# Patient Record
Sex: Female | Born: 1970 | Hispanic: Yes | Marital: Married | Smoking: Never smoker
Health system: Southern US, Community
[De-identification: ages and names within clinical notes are randomized; demographics above are authoritative.]

## PROBLEM LIST (undated history)

## (undated) DIAGNOSIS — R519 Headache, unspecified: Secondary | ICD-10-CM

## (undated) DIAGNOSIS — R51 Headache: Secondary | ICD-10-CM

## (undated) HISTORY — PX: NO PAST SURGERIES: SHX2092

---

## 2004-12-01 ENCOUNTER — Inpatient Hospital Stay (HOSPITAL_COMMUNITY): Admission: AD | Admit: 2004-12-01 | Discharge: 2004-12-01 | Payer: Self-pay | Admitting: *Deleted

## 2004-12-03 ENCOUNTER — Inpatient Hospital Stay (HOSPITAL_COMMUNITY): Admission: AD | Admit: 2004-12-03 | Discharge: 2004-12-03 | Payer: Self-pay | Admitting: *Deleted

## 2004-12-08 ENCOUNTER — Inpatient Hospital Stay (HOSPITAL_COMMUNITY): Admission: RE | Admit: 2004-12-08 | Discharge: 2004-12-08 | Payer: Self-pay | Admitting: *Deleted

## 2004-12-15 ENCOUNTER — Inpatient Hospital Stay (HOSPITAL_COMMUNITY): Admission: RE | Admit: 2004-12-15 | Discharge: 2004-12-15 | Payer: Self-pay | Admitting: *Deleted

## 2005-01-14 ENCOUNTER — Ambulatory Visit: Payer: Self-pay | Admitting: Family Medicine

## 2005-03-11 ENCOUNTER — Inpatient Hospital Stay (HOSPITAL_COMMUNITY): Admission: AD | Admit: 2005-03-11 | Discharge: 2005-03-11 | Payer: Self-pay | Admitting: Obstetrics and Gynecology

## 2005-07-01 ENCOUNTER — Ambulatory Visit: Payer: Self-pay | Admitting: Obstetrics and Gynecology

## 2005-11-05 ENCOUNTER — Inpatient Hospital Stay (HOSPITAL_COMMUNITY): Admission: AD | Admit: 2005-11-05 | Discharge: 2005-11-08 | Payer: Self-pay | Admitting: Obstetrics and Gynecology

## 2005-11-05 ENCOUNTER — Ambulatory Visit: Payer: Self-pay | Admitting: *Deleted

## 2010-06-06 ENCOUNTER — Emergency Department (HOSPITAL_COMMUNITY): Admission: EM | Admit: 2010-06-06 | Discharge: 2010-06-06 | Payer: Self-pay | Admitting: Emergency Medicine

## 2011-02-28 LAB — URINE MICROSCOPIC-ADD ON

## 2011-02-28 LAB — URINALYSIS, ROUTINE W REFLEX MICROSCOPIC
Bilirubin Urine: NEGATIVE
Glucose, UA: NEGATIVE mg/dL
Ketones, ur: 15 mg/dL — AB
Nitrite: POSITIVE — AB
Protein, ur: NEGATIVE mg/dL
Specific Gravity, Urine: 1.014 (ref 1.005–1.030)
Urobilinogen, UA: 1 mg/dL (ref 0.0–1.0)
pH: 7.5 (ref 5.0–8.0)

## 2011-02-28 LAB — DIFFERENTIAL
Basophils Absolute: 0 10*3/uL (ref 0.0–0.1)
Basophils Relative: 0 % (ref 0–1)
Eosinophils Absolute: 0 10*3/uL (ref 0.0–0.7)
Eosinophils Relative: 0 % (ref 0–5)
Lymphocytes Relative: 4 % — ABNORMAL LOW (ref 12–46)
Lymphs Abs: 0.4 10*3/uL — ABNORMAL LOW (ref 0.7–4.0)
Monocytes Absolute: 0.2 10*3/uL (ref 0.1–1.0)
Monocytes Relative: 2 % — ABNORMAL LOW (ref 3–12)
Neutro Abs: 10.2 10*3/uL — ABNORMAL HIGH (ref 1.7–7.7)
Neutrophils Relative %: 95 % — ABNORMAL HIGH (ref 43–77)

## 2011-02-28 LAB — CBC
HCT: 42.3 % (ref 36.0–46.0)
Hemoglobin: 14.8 g/dL (ref 12.0–15.0)
MCH: 32.6 pg (ref 26.0–34.0)
MCHC: 35 g/dL (ref 30.0–36.0)
MCV: 93.2 fL (ref 78.0–100.0)
Platelets: 156 10*3/uL (ref 150–400)
RBC: 4.55 MIL/uL (ref 3.87–5.11)
RDW: 12.2 % (ref 11.5–15.5)
WBC: 10.7 10*3/uL — ABNORMAL HIGH (ref 4.0–10.5)

## 2011-02-28 LAB — POCT I-STAT, CHEM 8
BUN: 7 mg/dL (ref 6–23)
Calcium, Ion: 1.11 mmol/L — ABNORMAL LOW (ref 1.12–1.32)
Chloride: 105 mEq/L (ref 96–112)
Creatinine, Ser: 0.7 mg/dL (ref 0.4–1.2)
Glucose, Bld: 122 mg/dL — ABNORMAL HIGH (ref 70–99)
HCT: 45 % (ref 36.0–46.0)
Hemoglobin: 15.3 g/dL — ABNORMAL HIGH (ref 12.0–15.0)
Potassium: 3.3 mEq/L — ABNORMAL LOW (ref 3.5–5.1)
Sodium: 136 mEq/L (ref 135–145)
TCO2: 20 mmol/L (ref 0–100)

## 2011-02-28 LAB — POCT PREGNANCY, URINE: Preg Test, Ur: NEGATIVE

## 2011-04-30 NOTE — Group Therapy Note (Signed)
Kathleen Gutierrez, BRADNER                  ACCOUNT NO.:  [id number]   MEDICAL RECORD NO.:  [id number]          PATIENT TYPE:  WOC   LOCATION:  [facility]                   FACILITY:  [facility]   PHYSICIAN:  Tinnie Gens, MD        DATE OF BIRTH:  1971-06-13   DATE OF SERVICE:  01/14/2005                                    CLINIC NOTE   CHIEF COMPLAINT:  Follow-up SAB.   HISTORY OF PRESENT ILLNESS:  Patient is a 40 year old G2, P1-0-1-1 who is  status post missed AB and Cytotec use to complete the AB on December 15, 2004.  She reported that she did pass tissue including a sac and that she had a  normal period on January 09, 2005.  The patient complains of some vaginal  itching and vaginal discharge.   PHYSICAL EXAMINATION:  VITAL SIGNS:  As noted in the chart.  GENERAL:  She is a well-developed, well-nourished Hispanic female in no  acute distress.  ABDOMEN:  Soft, nontender, nondistended.  GENITOURINARY:  Normal external female genitalia.  The vagina is pink and  rugated.  She has a moderate amount of yellow-brown discharge noted.  The  cervix is without lesion.  The uterus is small, anteverted.  Adnexa without  mass or tenderness.  Wet prep shows moderate clue cells.   IMPRESSION:  1.  Status post spontaneous abortion, complete.  2.  Bacterial vaginosis.   PLAN:  1.  Discussed with the patient contraception.  She will use spermicidal foam      and condoms.  She understands that she is not to get pregnant for      approximately three months.  2.  BV.  Patient will take Flagyl 500 mg p.o. b.i.d. for seven days.      TP/MEDQ  D:  01/14/2005  T:  01/14/2005  Job:  [id number]

## 2014-12-13 NOTE — L&D Delivery Note (Signed)
Delivery Note At 4:48 AM a viable female was delivered via Vaginal, Spontaneous Delivery (Presentation: ; Occiput Anterior).  APGAR: 2, 4; weight 2 lb 11.4 oz (1230 g).   Placenta status: Intact, Spontaneous Pathology.  Cord: 3 vessels with the following complications: None.  Cord pH: none  Anesthesia: Epidural  Episiotomy: None Lacerations: None Suture Repair: none Est. Blood Loss (mL): 150  Mom to postpartum.  Baby to NICU for prematurity.  Javaeh Muscatello A 04/25/2015, 5:56 AM

## 2015-01-04 LAB — OB RESULTS CONSOLE RPR: RPR: NONREACTIVE

## 2015-01-05 LAB — OB RESULTS CONSOLE HEPATITIS B SURFACE ANTIGEN: HEP B S AG: NEGATIVE

## 2015-01-05 LAB — OB RESULTS CONSOLE RUBELLA ANTIBODY, IGM: Rubella: NON-IMMUNE/NOT IMMUNE

## 2015-01-05 LAB — OB RESULTS CONSOLE RPR: RPR: NONREACTIVE

## 2015-01-05 LAB — OB RESULTS CONSOLE HIV ANTIBODY (ROUTINE TESTING): HIV: NONREACTIVE

## 2015-01-21 ENCOUNTER — Other Ambulatory Visit (HOSPITAL_COMMUNITY): Payer: Self-pay | Admitting: Maternal and Fetal Medicine

## 2015-01-21 DIAGNOSIS — O09522 Supervision of elderly multigravida, second trimester: Secondary | ICD-10-CM

## 2015-01-22 LAB — OB RESULTS CONSOLE GC/CHLAMYDIA: Gonorrhea: NEGATIVE

## 2015-01-22 LAB — OB RESULTS CONSOLE RPR: RPR: NONREACTIVE

## 2015-01-29 ENCOUNTER — Ambulatory Visit (HOSPITAL_COMMUNITY)
Admission: RE | Admit: 2015-01-29 | Discharge: 2015-01-29 | Disposition: A | Discharge status: Home / Self Care | Payer: Self-pay | Source: Ambulatory Visit | Attending: Maternal and Fetal Medicine | Admitting: Maternal and Fetal Medicine

## 2015-01-29 ENCOUNTER — Encounter (HOSPITAL_COMMUNITY): Payer: Self-pay

## 2015-01-29 ENCOUNTER — Ambulatory Visit (HOSPITAL_COMMUNITY)
Admission: RE | Admit: 2015-01-29 | Discharge: 2015-01-29 | Disposition: A | Discharge status: Home / Self Care | Payer: Self-pay | Source: Ambulatory Visit | Attending: Obstetrics | Admitting: Obstetrics

## 2015-01-29 ENCOUNTER — Other Ambulatory Visit (HOSPITAL_COMMUNITY): Payer: Self-pay | Admitting: Maternal and Fetal Medicine

## 2015-01-29 DIAGNOSIS — O09522 Supervision of elderly multigravida, second trimester: Secondary | ICD-10-CM

## 2015-01-29 DIAGNOSIS — O09529 Supervision of elderly multigravida, unspecified trimester: Secondary | ICD-10-CM | POA: Insufficient documentation

## 2015-01-29 DIAGNOSIS — Z3A15 15 weeks gestation of pregnancy: Secondary | ICD-10-CM | POA: Insufficient documentation

## 2015-01-29 DIAGNOSIS — O3662X0 Maternal care for excessive fetal growth, second trimester, not applicable or unspecified: Secondary | ICD-10-CM | POA: Insufficient documentation

## 2015-01-29 DIAGNOSIS — Z315 Encounter for genetic counseling: Secondary | ICD-10-CM | POA: Insufficient documentation

## 2015-01-29 DIAGNOSIS — Z3689 Encounter for other specified antenatal screening: Secondary | ICD-10-CM | POA: Insufficient documentation

## 2015-01-29 HISTORY — DX: Headache: R51

## 2015-01-29 HISTORY — DX: Headache, unspecified: R51.9

## 2015-01-29 LAB — CHROMOSOMES ANALYSIS FOR CF

## 2015-01-29 LAB — QUAD SCREEN FOR MFM

## 2015-01-29 NOTE — Progress Notes (Signed)
Appointment Date:01/29/2015  DOB: 04/20/1971 Referring Provider: Kathreen CosierMarshall, Bernard A, MD Attending: Alpha GulaPaul Whitecar, MD  Mrs. Kathleen Gutierrez was seen for genetic counseling because of a maternal age of 44 years.  She was counseled regarding maternal age and the association with risk for chromosome conditions due to nondisjunction. We reviewed chromosomes, nondisjunction, and the associated 1 in 35 risk for fetal aneuploidy related to a maternal age of 44 at 215 weeks gestation. We specifically discussed Down syndrome (trisomy 6721), trisomies 5713 and 8318, and sex chromosome aneuploidies (47,XXX and 47,XXY) including the common features and prognoses of each.  We reviewed available screening options including Quad screen, noninvasive prenatal screening (NIPS)/cell free DNA (cfDNA) testing, and detailed ultrasound. She was counseled that screening tests are used to modify a patient's a priori risk for aneuploidy, typically based on age. This estimate provides a pregnancy specific risk assessment. We reviewed the benefits and limitations of each option. Specifically, we discussed the conditions for which each test screens, the detection rates, and false positive rates of each. She was also counseled regarding diagnostic testing via amniocentesis. We reviewed the approximate 1 in 300-500 risk for complications from amniocentesis, including spontaneous pregnancy loss. After consideration of all the options, she elected to proceed with Quad screen. Those results will be available in 8-10 days.  The patient also expressed interest in having a detailed ultrasound. While ultrasound was performed today, the anatomy was limited by her early gestational age.  She is scheduled to return around [redacted] weeks gestation for a targeted anatomy ultrasound.   [name] was provided with written information regarding cystic fibrosis (CF) including the carrier frequency and incidence in the Hispanic population, the  availability of carrier testing and prenatal diagnosis if indicated. In addition, we discussed that CF is routinely screened for as part of the [place] newborn screening panel. She elected to proceed with testing today.  This is the fifth pregnancy for [name] and her 42 year-old husband. The couple have two healthy children and also had two early first trimester miscarriages. Both family histories were reviewed and found to be noncontributory for birth defects, intellectual disability, and known genetic conditions. [name] and her husband are both originally from [place]. Consanguinity was denied.  [name] denied exposure to environmental toxins or chemical agents. She denied the use of alcohol, tobacco or street drugs. She denied significant viral illnesses during the course of her pregnancy. Her medical and surgical histories were noncontributory.   I counseled [name] regarding the above risks and available options. The approximate face-to-face time with the genetic counselor was 40 minutes.  Most of the counseling was provided by Chelsey Burden, [employer] genetic counseling student, under my direct supervision.    Kathleen Gemmaaragh Wyndell Cardiff, MS,  Certified Genetic Counselor

## 2015-02-05 ENCOUNTER — Telehealth (HOSPITAL_COMMUNITY): Payer: Self-pay | Admitting: Genetics

## 2015-02-05 ENCOUNTER — Other Ambulatory Visit (HOSPITAL_COMMUNITY): Payer: Self-pay

## 2015-02-05 NOTE — Telephone Encounter (Signed)
Called Kathleen Gutierrez to review the results of her Quad screen and CF carrier screening.  Kathleen Gutierrez was identified by name and DOB.  We discussed that the Quad screen identified a low risk for Down syndrome, Trisomy 18 or spina bifida.  Additionally, the carrier testing for cystic fibrosis did not identify a change in the CF gene.  She was counseled that these are screening tests which are able to reduce her chance to have these conditions in her baby, but can not eliminate the chance.  All of her questions were answered. Mady Gemmaaragh Teller [place]field, MS Certified Genetic Counselor

## 2015-02-21 ENCOUNTER — Encounter (HOSPITAL_COMMUNITY): Payer: Self-pay

## 2015-02-21 ENCOUNTER — Ambulatory Visit (HOSPITAL_COMMUNITY)
Admission: RE | Admit: 2015-02-21 | Discharge: 2015-02-21 | Disposition: A | Discharge status: Home / Self Care | Payer: Self-pay | Source: Ambulatory Visit | Attending: Obstetrics | Admitting: Obstetrics

## 2015-02-21 ENCOUNTER — Other Ambulatory Visit (HOSPITAL_COMMUNITY): Payer: Self-pay | Admitting: Maternal and Fetal Medicine

## 2015-02-21 DIAGNOSIS — Z3A18 18 weeks gestation of pregnancy: Secondary | ICD-10-CM | POA: Insufficient documentation

## 2015-02-21 DIAGNOSIS — Z0489 Encounter for examination and observation for other specified reasons: Secondary | ICD-10-CM | POA: Insufficient documentation

## 2015-02-21 DIAGNOSIS — IMO0002 Reserved for concepts with insufficient information to code with codable children: Secondary | ICD-10-CM | POA: Insufficient documentation

## 2015-02-21 DIAGNOSIS — O09522 Supervision of elderly multigravida, second trimester: Secondary | ICD-10-CM | POA: Insufficient documentation

## 2015-02-21 DIAGNOSIS — Z36 Encounter for antenatal screening of mother: Secondary | ICD-10-CM | POA: Insufficient documentation

## 2015-04-22 ENCOUNTER — Encounter (HOSPITAL_COMMUNITY): Payer: Self-pay | Admitting: *Deleted

## 2015-04-22 ENCOUNTER — Inpatient Hospital Stay (HOSPITAL_COMMUNITY): Payer: Self-pay

## 2015-04-22 ENCOUNTER — Inpatient Hospital Stay (HOSPITAL_COMMUNITY)
Admission: AD | Admit: 2015-04-22 | Discharge: 2015-04-22 | Disposition: A | Discharge status: Home / Self Care | Payer: Self-pay | Source: Ambulatory Visit | Attending: Obstetrics | Admitting: Obstetrics

## 2015-04-22 DIAGNOSIS — O9989 Other specified diseases and conditions complicating pregnancy, childbirth and the puerperium: Secondary | ICD-10-CM | POA: Insufficient documentation

## 2015-04-22 DIAGNOSIS — R102 Pelvic and perineal pain: Secondary | ICD-10-CM

## 2015-04-22 DIAGNOSIS — O26872 Cervical shortening, second trimester: Secondary | ICD-10-CM

## 2015-04-22 DIAGNOSIS — Z3A26 26 weeks gestation of pregnancy: Secondary | ICD-10-CM

## 2015-04-22 DIAGNOSIS — N949 Unspecified condition associated with female genital organs and menstrual cycle: Secondary | ICD-10-CM

## 2015-04-22 DIAGNOSIS — N898 Other specified noninflammatory disorders of vagina: Secondary | ICD-10-CM | POA: Insufficient documentation

## 2015-04-22 DIAGNOSIS — O26892 Other specified pregnancy related conditions, second trimester: Secondary | ICD-10-CM

## 2015-04-22 DIAGNOSIS — Z3A27 27 weeks gestation of pregnancy: Secondary | ICD-10-CM | POA: Insufficient documentation

## 2015-04-22 LAB — WET PREP, GENITAL
Clue Cells Wet Prep HPF POC: NONE SEEN
Trich, Wet Prep: NONE SEEN
Yeast Wet Prep HPF POC: NONE SEEN

## 2015-04-22 LAB — URINALYSIS, ROUTINE W REFLEX MICROSCOPIC
BILIRUBIN URINE: NEGATIVE
GLUCOSE, UA: 100 mg/dL — AB
Ketones, ur: NEGATIVE mg/dL
Nitrite: NEGATIVE
PROTEIN: NEGATIVE mg/dL
Specific Gravity, Urine: 1.01 (ref 1.005–1.030)
Urobilinogen, UA: 0.2 mg/dL (ref 0.0–1.0)
pH: 7 (ref 5.0–8.0)

## 2015-04-22 LAB — POCT FERN TEST: POCT Fern Test: NEGATIVE

## 2015-04-22 LAB — URINE MICROSCOPIC-ADD ON

## 2015-04-22 MED ORDER — TERBUTALINE SULFATE 1 MG/ML IJ SOLN
0.2500 mg | Freq: Once | INTRAMUSCULAR | Status: AC
  Administered 2015-04-22: 0.25 mg via SUBCUTANEOUS
  Filled 2015-04-22: qty 1

## 2015-04-22 MED ORDER — BETAMETHASONE SOD PHOS & ACET 6 (3-3) MG/ML IJ SUSP
12.0000 mg | Freq: Once | INTRAMUSCULAR | Status: AC
  Administered 2015-04-22: 12 mg via INTRAMUSCULAR
  Filled 2015-04-22: qty 2

## 2015-04-22 MED ORDER — PROGESTERONE MICRONIZED 200 MG PO CAPS: ORAL_CAPSULE | ORAL | Status: DC

## 2015-04-22 MED ORDER — NIFEDIPINE ER 60 MG PO TB24: 60.0000 mg | ORAL_TABLET | Freq: Every day | ORAL | Status: DC

## 2015-04-22 NOTE — MAU Note (Signed)
Increase in d/c noted since the weekend, esp at night. Last wk was checked and everything was fine.  No pain, just some discomfort. Can't walk too much.  No pain when goes to bathroom, but when goes to get up-  "feels wide open"

## 2015-04-22 NOTE — Progress Notes (Signed)
Baby active 

## 2015-04-22 NOTE — Progress Notes (Signed)
[name] CNM in to discuss d/c plan with pt. Discussed need to return tomorrow about 1400 for 2nd betamethasone inj and then next Tues 5/17 at 7:30 for u/s at [facility]. Pt voices understanding.D/C to lobby to wait for spouse to pick her up

## 2015-04-22 NOTE — Progress Notes (Signed)
FFN collected and will send depending on what fern slide looks like

## 2015-04-22 NOTE — MAU Note (Signed)
STates sometimes feels "wet" down there.

## 2015-04-22 NOTE — Progress Notes (Signed)
Pt unaware of ctxs. 

## 2015-04-22 NOTE — MAU Provider Note (Signed)
Chief Complaint:  Vaginal Discharge   First Provider Initiated Contact with Patient 04/22/15 1144      HPI: Kathleen Gutierrez is a 44 y.o. [id number] at 60106w2d who presents to maternity admissions reporting increased vaginal discharge and pelvic pressure. Was evaluated in the office 1 week ago by Dr. Gaynell FaceMarshall. States she was told that vaginal discharge with cervical mucus, but that cervix was closed. Describes vaginal discharge is mucous. Denies vaginal bleeding or leaking of fluid. States the pelvic pressure is greater then what she experienced with previous pregnancies.  Unsure if she is having contractions. Good fetal movement.   Past Medical History: Past Medical History  Diagnosis Date  . Headache     Past obstetric history: OB History  Gravida Para Term Preterm AB SAB TAB Ectopic Multiple Living  5 2 2  2 2    2     # Outcome Date GA Lbr Len/2nd Weight Sex Delivery Anes PTL Lv  5 Current           4 SAB           3 SAB           2 Term           1 Term               Past Surgical History: Past Surgical History  Procedure Laterality Date  . No past surgeries       Family History: Family History  Problem Relation Age of Onset  . Hypertension Mother   . Diabetes Father   . Hypertension Sister   . Diabetes Sister   . Hypertension Brother     Social History: History  Substance Use Topics  . Smoking status: Never Smoker   . Smokeless tobacco: Not on file  . Alcohol Use: No    Allergies: No Known Allergies  Meds:  Prescriptions prior to admission  Medication Sig Dispense Refill Last Dose  . Acetaminophen (TYLENOL PO) Take by mouth.   Past Week at Unknown time  . Prenatal Vit-Fe Fumarate-FA (PRENATAL VITAMIN PO) Take by mouth.   04/21/2015 at Unknown time    ROS:  Review of Systems  Constitutional: Negative for fever and chills.  Gastrointestinal: Negative for nausea, vomiting, abdominal pain, diarrhea and constipation.  Genitourinary: Negative for  dysuria, urgency, frequency, hematuria and flank pain.       Negative for vaginal bleeding, leaking of fluid. Positive for vaginal discharge and pelvic pressure.    Physical Exam  Blood pressure 115/61, pulse 95, temperature 98.1 F (36.7 C), temperature source Oral, resp. rate 18, height 5\' 1"  (1.549 m), weight 156 lb (70.761 kg), last menstrual period 10/13/2014. GENERAL: Well-developed, well-nourished female in no acute distress. Anxious. HEART: normal rate RESP: normal effort GI: Abd soft, non-tender, gravid appropriate for gestational age. Pos BS x 4 MS: Extremities nontender, no edema, normal ROM NEURO: Alert and oriented x 4.  GU: NEFG, moderate amount of clear, mucoid, odorless discharge. Negative pooling. no blood, cervix clean. No CVAT Dilation: Closed/?short  FHT:  Baseline 140 , moderate variability, accelerations present, no decelerations Contractions: irreg, mild   Labs: Results for orders placed or performed during the hospital encounter of 04/22/15 (from the past 24 hour(s))  Urinalysis, Routine w reflex microscopic     Status: Abnormal   Collection Time: 04/22/15 10:15 AM  Result Value Ref Range   Color, Urine YELLOW YELLOW   APPearance CLEAR CLEAR   Specific Gravity, Urine 1.010 1.005 -  1.030   pH 7.0 5.0 - 8.0   Glucose, UA 100 (A) NEGATIVE mg/dL   Hgb urine dipstick SMALL (A) NEGATIVE   Bilirubin Urine NEGATIVE NEGATIVE   Ketones, ur NEGATIVE NEGATIVE mg/dL   Protein, ur NEGATIVE NEGATIVE mg/dL   Urobilinogen, UA 0.2 0.0 - 1.0 mg/dL   Nitrite NEGATIVE NEGATIVE   Leukocytes, UA SMALL (A) NEGATIVE  Urine microscopic-add on     Status: None   Collection Time: 04/22/15 10:15 AM  Result Value Ref Range   Squamous Epithelial / LPF RARE RARE   WBC, UA 0-2 <3 WBC/hpf  POCT fern test     Status: Normal   Collection Time: 04/22/15 12:16 PM  Result Value Ref Range   POCT Fern Test Negative = intact amniotic membranes   Wet prep, genital     Status: Abnormal    Collection Time: 04/22/15 12:25 PM  Result Value Ref Range   Yeast Wet Prep HPF POC NONE SEEN NONE SEEN   Trich, Wet Prep NONE SEEN NONE SEEN   Clue Cells Wet Prep HPF POC NONE SEEN NONE SEEN   WBC, Wet Prep HPF POC FEW (A) NONE SEEN    Imaging:  Cervical length 1.8 cm with funneling.  MAU Course: UA, wet prep, Fern.  Concern for short cervix. Will check ultrasound for cervical length.  Dr. Gaynell FaceMarshall informed of cervical length 1.8 cm in MFM recommendations. BMZ, terbutaline given. Rx vaginal progesterone, Procardia. Will manage outpatient for now per Dr. Gaynell FaceMarshall.  Assessment: 1. Short cervical length during pregnancy, second trimester   2. Pelvic pressure in pregnancy, antepartum, second trimester     Plan: Discharge home in stable condition per consult with Dr. Gaynell FaceMarshall. Bedrest. Betamethasone No. 2 in maternity admissions tomorrow.  Preterm Labor precautions and fetal kick counts     Follow-up Information    Follow up with MARSHALL,BERNARD A, MD In 1 week.   Specialty:  Obstetrics and Gynecology   Contact information:   [address] [place] [id number] [phone]       Follow up with [facility] In 1 week.   Specialty:  Maternal and Fetal Medicine   Why:  for repeat ultrasound   Contact information:   [address] [id number] mc [place] [phone]      [facility]   Why:  For worsening pelvic pressure, contractions or bleeding   Contact information:   [address] [id number] mc [place] [id number] [phone]        Medication List    TAKE these medications        NIFEdipine 60 MG 24 hr tablet  Commonly known as:  PROCARDIA-XL/ADALAT CC  Take 1 tablet (60 mg total) by mouth daily.     PRENATAL VITAMIN PO  Take by mouth.     progesterone 200 MG capsule  Commonly known as:  PROMETRIUM  Place  one capsule vaginally at bedtime     TYLENOL PO  Take by mouth.        [place] Elinor Kleine, CNM 04/22/2015 3:15 PM

## 2015-04-22 NOTE — Discharge Instructions (Signed)
Bed rest 1 week.   Insuficiencia cervical (Cervical Insufficiency)  Se llama insuficiencia cervical cuando el cuello del tero se debilita y comienza a abrirse (dilatarse) y a Geographical information systems officerhacerse ms delgado (borrarse) antes de que Firefighterel embarazo llegue a trmino y sin que se inicie el trabajo de [place]. Tambin se llama crvix incompetente. Puede ocurrir en el segundo o tercer trimestre del [place]embarazo, cuando el feto comienza a presionar sobre el cuello del tero. Puede ser causa de aborto, de rotura prematura de las membranas pretrmino (RPMP), o un nacimiento prematuro (nacimiento pretrmino).  FACTORES DE RIESGO  Tendr ms riesgo de sufrir insuficiencia cervical si:   El cuello del tero es ms corto que lo normal.  Ha sufrido un dao o lesin en el cuello del tero en un embarazo o ciruga anteriores.  Ha nacido con un defecto en el cuello del tero.  Le han realizado algn procedimiento en el cuello del tero, como una biopsia.  Tiene una historia de insuficiencia cervical.  Tiene una historia de RPMP.  Ha interrumpido varios embarazos anteriores con un aborto.  Ha estado expuesta a la droga dietiletilbestrol (DES). SNTOMAS  Muchas veces la mujer no presenta sntomas. Otras veces hay sntomas leves que generalmente comienzan entre la semana 14 y la 20 del Psychiatristembarazo. Los sntomas pueden durar [address] o [address]. Estos sntomas son:   Victorio PalmLigero manchado o sangrado por la vagina.  Presin en la pelvis.  Un cambio en el aspecto en el flujo vaginal, como una secrecin que cambia de ser clara, blanca o amarillo claro a rosada o marrn.  Dolor de espalda.  Dolor o clicos abdominales. DIAGNSTICO  La insuficiencia cervical no puede diagnosticarse antes de quedar embarazada. Cuando est embarazada, el mdico le har una historia clnica y preguntar si ha sufrido problemas en embarazos anteriores. Informe a su mdico acerca de todo procedimiento que le hayan practicado en el cuello del tero, o si  tiene historia de abortos espontneos o de insuficiencia cervical. Si el mdico considera que tiene alto riesgo de sufrir insuficiencia cervical o presenta sntomas de insuficiencia cervical, podr indicarle:   Realizar un examen plvico Durante el mismo verificar:  Si hay membranas (saco amnitico) saliendo por el cuello del tero.  Anormalidades en el cuello del tero.  Lesiones en el cuello del tero.  La presencia de contracciones.  Realizar una ecografa para medir el largo y el grosor del cuello del tero. TRATAMIENTO  Si ha sido diagnosticada con insuficiencia cervical, el mdico podr indicar:   Limitar la actividad fsica.  Hacer reposo en cama, en su casa o en el hospital.  Hacer reposo plvico, lo que significa no tener relaciones sexuales no colocar nada dentro de la vagina.  Un cerclaje en el que se sutura el cuello del tero para cerrarlo e impedir que se abra anticipadamente. Los puntos (suturas) se retiran entre la semana 36 y la 38 para evitar problemas durante el Houstontrabajo de Mackayparto. El cerclaje puede recomendarse durante el embarazo si usted tiene una historia de abortos espontneos o partos prematuros sin causa aparente. Tambin puede indicarse si tiene un cuello uterino corto, diagnosticado con una ecografa o si el mdico ha hallado que su cuello se ha dilatado antes de las 24 semanas del [place] Summitembarazo. Limitar la actividad fsica y Radio producerhacer reposo en cama puede ayudar o no a impedir un Sport and exercise psychologistparto prematuro.  CUNDO DEBE BUSCAR ASISTENCIA MDICA INMEDIATA?  Solicite atencin mdica de inmediato si tiene sntomas de insuficiencia cervical. Debe concurrir al hospital para  ser controlada inmediatamente.  Document Released: 11/29/2005 Document Revised: 04/15/2014 [facility] Patient Information 2015 [place], [place]. This information is not intended to replace advice given to you by your health care provider. Make sure you discuss any questions you have with your health care  provider.  Reposo plvico  (Pelvic Rest) El reposo plvico se recomienda a las mujeres cuando:   La placenta cubre parcial o completamente la abertura del cuello del tero (placenta previa).  Hay sangrado entre la pared del tero y el saco amnitico en el primer trimestre (hemorragia subcorinica).  El cuello uterino comienza a abrirse sin iniciarse el trabajo de parto (cuello uterino incompetente, insuficiencia cervical).  El Reed Pointtrabajo de parto se inicia muy pronto (parto prematuro). INSTRUCCIONES PARA EL CUIDADO EN EL HOGAR   No tenga relaciones sexuales, estimulacin, ni orgasmos.  No use tampones, no se haga duchas vaginales ni coloque ningn objeto en la vagina.  No levante objetos que pesen ms de 10 libras (4,5 kg).  Evite las actividades extenuantes o tensionar los msculos de la pelvis. SOLICITE ATENCIN MDICA SI:   Tiene un sangrado vaginal durante el embarazo. Considrelo como una posible emergencia.  Siente clicos en la zona baja del estmago (ms fuertes que los clicos menstruales).  Nota flujo vaginal (acuoso, con moco o Dolan Springssangre).  Siente un dolor en la espalda leve y sordo.  Tiene contracciones regulares o endurecimiento del tero. SOLICITE ATENCIN MDICA DE INMEDIATO SI:  Observa sangrado vaginal y tiene placenta previa.  Document Released: 08/23/2012 [facility] Patient Information 2015 [place]ExitCare, [place]. This information is not intended to replace advice given to you by your health care provider. Make sure you discuss any questions you have with your health care provider.

## 2015-04-22 NOTE — Progress Notes (Signed)
[name] CNM in to see pt and discuss test results and d/c plan

## 2015-04-22 NOTE — MAU Note (Signed)
Urine in lab 

## 2015-04-22 NOTE — Progress Notes (Signed)
Dorathy KinsmanVirginia SMith CNM

## 2015-04-22 NOTE — Progress Notes (Signed)
V. Smith CNM in earlier to discuss test results and d/c plan. Written and verbal d/c instructions given and understanding voiced 

## 2015-04-23 ENCOUNTER — Inpatient Hospital Stay (HOSPITAL_COMMUNITY)
Admission: AD | Admit: 2015-04-23 | Discharge: 2015-04-23 | Disposition: A | Discharge status: Home / Self Care | Payer: Self-pay | Source: Ambulatory Visit | Attending: Obstetrics | Admitting: Obstetrics

## 2015-04-23 DIAGNOSIS — Z3A27 27 weeks gestation of pregnancy: Secondary | ICD-10-CM | POA: Insufficient documentation

## 2015-04-23 LAB — GC/CHLAMYDIA PROBE AMP (~~LOC~~) NOT AT ARMC
CHLAMYDIA, DNA PROBE: NEGATIVE
Neisseria Gonorrhea: NEGATIVE

## 2015-04-23 MED ORDER — BETAMETHASONE SOD PHOS & ACET 6 (3-3) MG/ML IJ SUSP
12.0000 mg | Freq: Once | INTRAMUSCULAR | Status: AC
  Administered 2015-04-23: 12 mg via INTRAMUSCULAR
  Filled 2015-04-23: qty 2

## 2015-04-24 ENCOUNTER — Inpatient Hospital Stay (HOSPITAL_COMMUNITY): Payer: Medicaid Other

## 2015-04-24 ENCOUNTER — Inpatient Hospital Stay (HOSPITAL_COMMUNITY)
Admission: AD | Admit: 2015-04-24 | Discharge: 2015-04-27 | DRG: 775 | Disposition: A | Discharge status: Home / Self Care | Payer: Medicaid Other | Source: Ambulatory Visit | Attending: Obstetrics | Admitting: Obstetrics

## 2015-04-24 ENCOUNTER — Encounter (HOSPITAL_COMMUNITY): Payer: Self-pay | Admitting: *Deleted

## 2015-04-24 DIAGNOSIS — O09523 Supervision of elderly multigravida, third trimester: Secondary | ICD-10-CM | POA: Diagnosis not present

## 2015-04-24 DIAGNOSIS — Z3A27 27 weeks gestation of pregnancy: Secondary | ICD-10-CM | POA: Insufficient documentation

## 2015-04-24 DIAGNOSIS — IMO0002 Reserved for concepts with insufficient information to code with codable children: Secondary | ICD-10-CM | POA: Insufficient documentation

## 2015-04-24 DIAGNOSIS — O26873 Cervical shortening, third trimester: Secondary | ICD-10-CM | POA: Insufficient documentation

## 2015-04-24 DIAGNOSIS — Z3490 Encounter for supervision of normal pregnancy, unspecified, unspecified trimester: Secondary | ICD-10-CM

## 2015-04-24 LAB — URINALYSIS, ROUTINE W REFLEX MICROSCOPIC
Bilirubin Urine: NEGATIVE
GLUCOSE, UA: NEGATIVE mg/dL
KETONES UR: NEGATIVE mg/dL
Nitrite: NEGATIVE
Protein, ur: NEGATIVE mg/dL
Specific Gravity, Urine: 1.01 (ref 1.005–1.030)
Urobilinogen, UA: 0.2 mg/dL (ref 0.0–1.0)
pH: 6.5 (ref 5.0–8.0)

## 2015-04-24 LAB — CBC
HCT: 32.9 % — ABNORMAL LOW (ref 36.0–46.0)
HEMOGLOBIN: 11.6 g/dL — AB (ref 12.0–15.0)
MCH: 32.2 pg (ref 26.0–34.0)
MCHC: 35.3 g/dL (ref 30.0–36.0)
MCV: 91.4 fL (ref 78.0–100.0)
Platelets: 224 10*3/uL (ref 150–400)
RBC: 3.6 MIL/uL — ABNORMAL LOW (ref 3.87–5.11)
RDW: 13 % (ref 11.5–15.5)
WBC: 13.5 10*3/uL — ABNORMAL HIGH (ref 4.0–10.5)

## 2015-04-24 LAB — WET PREP, GENITAL
TRICH WET PREP: NONE SEEN
YEAST WET PREP: NONE SEEN

## 2015-04-24 LAB — FETAL FIBRONECTIN: Fetal Fibronectin: POSITIVE — AB

## 2015-04-24 LAB — URINE MICROSCOPIC-ADD ON

## 2015-04-24 LAB — TYPE AND SCREEN
ABO/RH(D): O POS
Antibody Screen: NEGATIVE

## 2015-04-24 MED ORDER — PRENATAL MULTIVITAMIN CH: 1.0000 | ORAL_TABLET | Freq: Every day | ORAL | Status: DC

## 2015-04-24 MED ORDER — PROMETHAZINE HCL 25 MG/ML IJ SOLN
12.5000 mg | Freq: Once | INTRAMUSCULAR | Status: AC
  Administered 2015-04-24: 12.5 mg via INTRAVENOUS
  Filled 2015-04-24: qty 1

## 2015-04-24 MED ORDER — LACTATED RINGERS IV SOLN
INTRAVENOUS | Status: DC
  Administered 2015-04-24: 17:00:00 via INTRAVENOUS

## 2015-04-24 MED ORDER — CALCIUM CARBONATE ANTACID 500 MG PO CHEW
2.0000 | CHEWABLE_TABLET | ORAL | Status: DC | PRN
Start: 2015-04-24 — End: 2015-04-25

## 2015-04-24 MED ORDER — LACTATED RINGERS IV BOLUS (SEPSIS)
1000.0000 mL | Freq: Once | INTRAVENOUS | Status: AC
  Administered 2015-04-24: 1000 mL via INTRAVENOUS

## 2015-04-24 MED ORDER — MAGNESIUM SULFATE BOLUS VIA INFUSION
4.0000 g | Freq: Once | INTRAVENOUS | Status: AC
  Administered 2015-04-24: 4 g via INTRAVENOUS
  Filled 2015-04-24: qty 500

## 2015-04-24 MED ORDER — BUTORPHANOL TARTRATE 1 MG/ML IJ SOLN
2.0000 mg | Freq: Once | INTRAMUSCULAR | Status: AC
  Administered 2015-04-24: 2 mg via INTRAVENOUS
  Filled 2015-04-24: qty 2

## 2015-04-24 MED ORDER — SODIUM CHLORIDE 0.9 % IV SOLN
2.0000 g | Freq: Four times a day (QID) | INTRAVENOUS | Status: DC
  Administered 2015-04-24 – 2015-04-25 (×2): 2 g via INTRAVENOUS
  Filled 2015-04-24 (×4): qty 2000

## 2015-04-24 MED ORDER — MORPHINE SULFATE 10 MG/ML IJ SOLN: 10.0000 mg | Freq: Once | INTRAMUSCULAR | Status: DC

## 2015-04-24 MED ORDER — ZOLPIDEM TARTRATE 5 MG PO TABS: 5.0000 mg | ORAL_TABLET | Freq: Every evening | ORAL | Status: DC | PRN

## 2015-04-24 MED ORDER — DOCUSATE SODIUM 100 MG PO CAPS: 100.0000 mg | ORAL_CAPSULE | Freq: Every day | ORAL | Status: DC

## 2015-04-24 MED ORDER — ACETAMINOPHEN 325 MG PO TABS
650.0000 mg | ORAL_TABLET | ORAL | Status: DC | PRN
Start: 2015-04-24 — End: 2015-04-25

## 2015-04-24 MED ORDER — PROMETHAZINE HCL 25 MG/ML IJ SOLN: 25.0000 mg | Freq: Once | INTRAMUSCULAR | Status: DC

## 2015-04-24 MED ORDER — MAGNESIUM SULFATE 50 % IJ SOLN
3.0000 g/h | INTRAVENOUS | Status: DC
  Administered 2015-04-24: 3 g/h via INTRAVENOUS

## 2015-04-24 MED ORDER — MAGNESIUM SULFATE 50 % IJ SOLN
2.0000 g/h | INTRAVENOUS | Status: DC
  Administered 2015-04-24: 2 g/h via INTRAVENOUS
  Filled 2015-04-24: qty 80

## 2015-04-24 NOTE — MAU Note (Signed)
Pt presents to MAU with complaints of contractions that started this morning around 7 with a small amount of discharge. PT states she will given pills for the contractions a couple of weeks ago. Denies any vaginal bleeding

## 2015-04-24 NOTE — MAU Note (Signed)
Pt states she is feeling uc's every 5-10 minutes since this morning, feels like she has been leaking ever since she was here 2 days ago.  Took procardia this morning.  Denies bleeding.

## 2015-04-24 NOTE — MAU Provider Note (Signed)
History     CSN: [id number]  Arrival date and time: 04/24/15 1315   First Provider Initiated Contact with Patient 04/24/15 1401      Chief Complaint  Patient presents with  . Contractions  . Rupture of Membranes   HPI  [name] is a 44 y.o. [id number] at 8064w4d who presents to MAU today with complaint of contraction and LOF. The patient was seen in MAU on 5/10 and had US that showed cervical funneling and shortening to 1.8 cm. Betamethasone was given x 2. Patient states that contractions have continued, even while using Procardia daily. She denies history of PTL or PTD with previous pregnancies. She had negative fern, wet prep and GC/Chlamydia 5/10 as well.   OB History    Gravida Para Term Preterm AB TAB SAB Ectopic Multiple Living   5 2 2  2  2   2       Past Medical History  Diagnosis Date  . Headache     Past Surgical History  Procedure Laterality Date  . No past surgeries      Family History  Problem Relation Age of Onset  . Hypertension Mother   . Diabetes Father   . Hypertension Sister   . Diabetes Sister   . Hypertension Brother     History  Substance Use Topics  . Smoking status: Never Smoker   . Smokeless tobacco: Not on file  . Alcohol Use: No    Allergies: No Known Allergies  Prescriptions prior to admission  Medication Sig Dispense Refill Last Dose  . NIFEdipine (PROCARDIA-XL/ADALAT CC) 60 MG 24 hr tablet Take 1 tablet (60 mg total) by mouth daily. 30 tablet 1 04/24/2015 at Unknown time  . Prenatal Vit-Fe Fumarate-FA (PRENATAL VITAMIN PO) Take 1 tablet by mouth daily.    04/23/2015 at Unknown time  . progesterone (PROMETRIUM) 200 MG capsule Place one capsule vaginally at bedtime 30 capsule 3 04/23/2015 at Unknown time  . Acetaminophen (TYLENOL PO) Take by mouth.   Past Week at Unknown time    Review of Systems  Constitutional: Negative for fever and malaise/fatigue.  Gastrointestinal: Positive for abdominal pain. Negative for  nausea, vomiting, diarrhea and constipation.  Genitourinary: Negative for dysuria, urgency and frequency.       Neg - vaginal bleeding + LOF   Physical Exam   Blood pressure 98/46, pulse 75, temperature 98.3 F (36.8 C), temperature source Oral, resp. rate 18, height 5\' 1"  (1.549 m), weight 155 lb (70.308 kg), last menstrual period 10/13/2014.  Physical Exam  Nursing note and vitals reviewed. Constitutional: She is oriented to person, place, and time. She appears well-developed and well-nourished. No distress.  HENT:  Head: Normocephalic and atraumatic.  Cardiovascular: Normal rate.   Respiratory: Effort normal.  GI: Soft. She exhibits no distension and no mass. There is no tenderness. There is no rebound and no guarding.  Genitourinary: Uterus is enlarged. Uterus is not tender. Cervix exhibits discharge (mucuous). Cervix exhibits no motion tenderness and no friability. No bleeding in the vagina. Vaginal discharge (small amount of thin, yellow discharge noted) found.  Neurological: She is alert and oriented to person, place, and time.  Skin: Skin is warm and dry. No erythema.  Psychiatric: She has a normal mood and affect.  Dilation: 1.5 Effacement (%): 50 Exam by:: Harlon FlorJ. Wenzel A  Results for orders placed or performed during the hospital encounter of 04/24/15 (from the past 24 hour(s))  Urinalysis, Routine w reflex microscopic  Status: Abnormal   Collection Time: 04/24/15  1:35 PM  Result Value Ref Range   Color, Urine YELLOW YELLOW   APPearance CLEAR CLEAR   Specific Gravity, Urine 1.010 1.005 - 1.030   pH 6.5 5.0 - 8.0   Glucose, UA NEGATIVE NEGATIVE mg/dL   Hgb urine dipstick SMALL (A) NEGATIVE   Bilirubin Urine NEGATIVE NEGATIVE   Ketones, ur NEGATIVE NEGATIVE mg/dL   Protein, ur NEGATIVE NEGATIVE mg/dL   Urobilinogen, UA 0.2 0.0 - 1.0 mg/dL   Nitrite NEGATIVE NEGATIVE   Leukocytes, UA LARGE (A) NEGATIVE  Urine microscopic-add on     Status: Abnormal   Collection  Time: 04/24/15  1:35 PM  Result Value Ref Range   Squamous Epithelial / LPF FEW (A) RARE   WBC, UA 21-50 <3 WBC/hpf   RBC / HPF 0-2 <3 RBC/hpf   Bacteria, UA MANY (A) RARE  Wet prep, genital     Status: Abnormal   Collection Time: 04/24/15  2:13 PM  Result Value Ref Range   Yeast Wet Prep HPF POC NONE SEEN NONE SEEN   Trich, Wet Prep NONE SEEN NONE SEEN   Clue Cells Wet Prep HPF POC FEW (A) NONE SEEN   WBC, Wet Prep HPF POC MANY (A) NONE SEEN  Fetal fibronectin     Status: Abnormal   Collection Time: 04/24/15  2:13 PM  Result Value Ref Range   Fetal Fibronectin POSITIVE (A) NEGATIVE    Fetal Monitoring: Baseline: 140 bpm, minimal variabilty, few 10 x 10 accelerations, no decelerations Contractions: q 4 minutes with intermittent moderate variabilty   MAU Course  Procedures None  MDM Wet prep, UA, FFN today US ordered for cervical length, AFI and BPP  1650 - Discussed patient, including history, labs, exam and US results with Dr. Gaynell FaceMarshall. He agrees with plan to admit to Antenatal for IV MgSO4. Verbal routine admit orders given including 4g loading dose and 2 g/hour  Assessment and Plan  A: SIUP at 1074w4d Preterm labor Preterm cervical dilation  P: Admit to Antenatal for MgSO4 and continues monitoring and toco  Marny LowensteinJulie N Wenzel, PA-C  04/24/2015, 5:01 PM

## 2015-04-25 ENCOUNTER — Inpatient Hospital Stay (HOSPITAL_COMMUNITY): Payer: Medicaid Other | Admitting: Anesthesiology

## 2015-04-25 ENCOUNTER — Encounter (HOSPITAL_COMMUNITY): Payer: Self-pay | Admitting: *Deleted

## 2015-04-25 DIAGNOSIS — Z3490 Encounter for supervision of normal pregnancy, unspecified, unspecified trimester: Secondary | ICD-10-CM

## 2015-04-25 LAB — ABO/RH: ABO/RH(D): O POS

## 2015-04-25 LAB — RPR: RPR: NONREACTIVE

## 2015-04-25 MED ORDER — LANOLIN HYDROUS EX OINT: TOPICAL_OINTMENT | CUTANEOUS | Status: DC | PRN

## 2015-04-25 MED ORDER — DIPHENHYDRAMINE HCL 50 MG/ML IJ SOLN: 12.5000 mg | INTRAMUSCULAR | Status: DC | PRN

## 2015-04-25 MED ORDER — OXYCODONE-ACETAMINOPHEN 5-325 MG PO TABS: 1.0000 | ORAL_TABLET | ORAL | Status: DC | PRN

## 2015-04-25 MED ORDER — FENTANYL 2.5 MCG/ML BUPIVACAINE 1/10 % EPIDURAL INFUSION (WH - ANES)
14.0000 mL/h | INTRAMUSCULAR | Status: DC | PRN
  Administered 2015-04-25: 14 mL/h via EPIDURAL
  Filled 2015-04-25 (×2): qty 125

## 2015-04-25 MED ORDER — BENZOCAINE-MENTHOL 20-0.5 % EX AERO: 1.0000 "application " | INHALATION_SPRAY | CUTANEOUS | Status: DC | PRN

## 2015-04-25 MED ORDER — TETANUS-DIPHTH-ACELL PERTUSSIS 5-2.5-18.5 LF-MCG/0.5 IM SUSP
0.5000 mL | Freq: Once | INTRAMUSCULAR | Status: AC
  Administered 2015-04-26: 0.5 mL via INTRAMUSCULAR

## 2015-04-25 MED ORDER — DIPHENHYDRAMINE HCL 25 MG PO CAPS: 25.0000 mg | ORAL_CAPSULE | Freq: Four times a day (QID) | ORAL | Status: DC | PRN

## 2015-04-25 MED ORDER — ONDANSETRON HCL 4 MG PO TABS: 4.0000 mg | ORAL_TABLET | ORAL | Status: DC | PRN

## 2015-04-25 MED ORDER — LIDOCAINE HCL (PF) 1 % IJ SOLN
INTRAMUSCULAR | Status: DC | PRN
  Administered 2015-04-25: 4 mL
  Administered 2015-04-25: 6 mL

## 2015-04-25 MED ORDER — PHENYLEPHRINE 40 MCG/ML (10ML) SYRINGE FOR IV PUSH (FOR BLOOD PRESSURE SUPPORT)
80.0000 ug | PREFILLED_SYRINGE | INTRAVENOUS | Status: DC | PRN
  Administered 2015-04-25 (×2): 80 ug via INTRAVENOUS
  Filled 2015-04-25 (×2): qty 20
  Filled 2015-04-25: qty 2

## 2015-04-25 MED ORDER — LIDOCAINE HCL (PF) 1 % IJ SOLN
30.0000 mL | INTRAMUSCULAR | Status: DC | PRN
  Filled 2015-04-25: qty 30

## 2015-04-25 MED ORDER — ONDANSETRON HCL 4 MG/2ML IJ SOLN: 4.0000 mg | Freq: Four times a day (QID) | INTRAMUSCULAR | Status: DC | PRN

## 2015-04-25 MED ORDER — OXYTOCIN 40 UNITS IN LACTATED RINGERS INFUSION - SIMPLE MED: 62.5000 mL/h | INTRAVENOUS | Status: DC | PRN

## 2015-04-25 MED ORDER — LACTATED RINGERS IV SOLN
500.0000 mL | INTRAVENOUS | Status: DC | PRN
  Administered 2015-04-25: 500 mL via INTRAVENOUS

## 2015-04-25 MED ORDER — OXYCODONE-ACETAMINOPHEN 5-325 MG PO TABS: 2.0000 | ORAL_TABLET | ORAL | Status: DC | PRN

## 2015-04-25 MED ORDER — OXYTOCIN BOLUS FROM INFUSION
500.0000 mL | INTRAVENOUS | Status: DC
  Administered 2015-04-25: 500 mL via INTRAVENOUS

## 2015-04-25 MED ORDER — OXYTOCIN 40 UNITS IN LACTATED RINGERS INFUSION - SIMPLE MED
62.5000 mL/h | INTRAVENOUS | Status: DC
  Filled 2015-04-25: qty 1000

## 2015-04-25 MED ORDER — ONDANSETRON HCL 4 MG/2ML IJ SOLN: 4.0000 mg | INTRAMUSCULAR | Status: DC | PRN

## 2015-04-25 MED ORDER — SIMETHICONE 80 MG PO CHEW: 80.0000 mg | CHEWABLE_TABLET | ORAL | Status: DC | PRN

## 2015-04-25 MED ORDER — LACTATED RINGERS IV SOLN: INTRAVENOUS | Status: DC

## 2015-04-25 MED ORDER — ACETAMINOPHEN 325 MG PO TABS: 650.0000 mg | ORAL_TABLET | ORAL | Status: DC | PRN

## 2015-04-25 MED ORDER — FENTANYL 2.5 MCG/ML BUPIVACAINE 1/10 % EPIDURAL INFUSION (WH - ANES): 14.0000 mL/h | INTRAMUSCULAR | Status: DC | PRN

## 2015-04-25 MED ORDER — IBUPROFEN 600 MG PO TABS
600.0000 mg | ORAL_TABLET | Freq: Four times a day (QID) | ORAL | Status: DC
  Administered 2015-04-25 – 2015-04-27 (×9): 600 mg via ORAL
  Filled 2015-04-25 (×9): qty 1

## 2015-04-25 MED ORDER — PRENATAL MULTIVITAMIN CH
1.0000 | ORAL_TABLET | Freq: Every day | ORAL | Status: DC
  Administered 2015-04-25 – 2015-04-27 (×3): 1 via ORAL
  Filled 2015-04-25 (×3): qty 1

## 2015-04-25 MED ORDER — CITRIC ACID-SODIUM CITRATE 334-500 MG/5ML PO SOLN: 30.0000 mL | ORAL | Status: DC | PRN

## 2015-04-25 MED ORDER — ZOLPIDEM TARTRATE 5 MG PO TABS: 5.0000 mg | ORAL_TABLET | Freq: Every evening | ORAL | Status: DC | PRN

## 2015-04-25 MED ORDER — WITCH HAZEL-GLYCERIN EX PADS
1.0000 "application " | MEDICATED_PAD | CUTANEOUS | Status: DC | PRN
  Administered 2015-04-25: 1 via TOPICAL

## 2015-04-25 MED ORDER — EPHEDRINE 5 MG/ML INJ
10.0000 mg | INTRAVENOUS | Status: DC | PRN
  Filled 2015-04-25: qty 2

## 2015-04-25 MED ORDER — SENNOSIDES-DOCUSATE SODIUM 8.6-50 MG PO TABS
2.0000 | ORAL_TABLET | ORAL | Status: DC
  Administered 2015-04-25 – 2015-04-27 (×2): 2 via ORAL
  Filled 2015-04-25 (×2): qty 2

## 2015-04-25 MED ORDER — DIBUCAINE 1 % RE OINT: 1.0000 "application " | TOPICAL_OINTMENT | RECTAL | Status: DC | PRN

## 2015-04-25 MED ORDER — FLEET ENEMA 7-19 GM/118ML RE ENEM: 1.0000 | ENEMA | RECTAL | Status: DC | PRN

## 2015-04-25 NOTE — Progress Notes (Signed)
Kathleen Gutierrez is Gutierrez 44 y.o. [id number] at 1729w5d by LMP admitted for preterm labor   Subjective:   Objective: BP 98/51 mmHg  Pulse 92  Temp(Src) 98.3 F (36.8 C) (Oral)  Resp 18  Ht 5\' 1"  (1.549 m)  Wt 155 lb (70.308 kg)  BMI 29.30 kg/m2  SpO2 94%  LMP 10/13/2014 I/O last 3 completed shifts: In: 171.3 [I.V.:171.3] Out: -  Total I/O In: 2602.8 [P.O.:140; I.V.:2350.3; Other:12.5; IV Piggyback:100] Out: 400 [Urine:400]  FHT:  FHR: 140 bpm, variability: minimal ,  accelerations:  Present,  decelerations:  Absent UC:   regular, every 3-5 minutes SVE:   Dilation: 10 Effacement (%): 70 Station: +1, +2 Exam by:: n licato rn  Labs: Lab Results  Component Value Date   WBC 13.5* 04/24/2015   HGB 11.6* 04/24/2015   HCT 32.9* 04/24/2015   MCV 91.4 04/24/2015   PLT 224 04/24/2015    Assessment / Plan: Spontaneous labor, progressing normally  Labor: Progressing normally Preeclampsia:  none Fetal Wellbeing:  Category I Pain Control:  Epidural I/D:  n/Gutierrez Anticipated MOD:  NSVD  Kathleen Gutierrez 04/25/2015, 4:41 AM

## 2015-04-25 NOTE — Anesthesia Preprocedure Evaluation (Signed)

## 2015-04-25 NOTE — Consult Note (Signed)
[facility] 04[facility]25/[facility]    8:07 AM  Neonatal Medicine Consultation         Kathleen Gutierrez          MRN:  [id number]  I was called at the request of the patient's obstetrician (Dr. Gaynell FaceMarshall) to speak to this patient due to impending premature birth.  The patient's prenatal course includes shortened cervix and preterm labor.  She is 27 weeks currently.  She is admitted to antenatal unit, and is receiving treatment that includes magnesium sulfate and ampicillin.  The baby is a female.  I reviewed expectations for a baby born at 27-28 weeks, including survival, length of stay, morbidities such as respiratory distress, IVH, infection, feeding intolerance, retinopathy.  I described how we provide respiratory and feeding support.  Mom plans to breast feed, which I encouraged as best for the baby (with supplementations for needed calories).  I let mom know that the baby's outlook generally improves the longer she remains undelivered.  I spent 20 minutes reviewing the record, speaking to the patient, and entering appropriate documentation.  More than 50% of the time was spent face to face with patient.   _____________________ Electronically Signed By: Angelita InglesMcCrae S. Maximilliano Kersh, MD Neonatologist

## 2015-04-25 NOTE — Lactation Note (Addendum)
This note was copied from the chart of Kathleen Gutierrez. Lactation Consultation Note  Patient Name: Kathleen Gutierrez NWGNF'AToday's Date: 04/25/2015 Reason for consult: Initial assessment;NICU baby;Infant < 6lbs I started mom puping with DEP in premie setting, advised her to pump every 2-3 hours during the day, and to sleep 5 hours at night. Goal at least 5-8 pumpings a day. Mom has had an abundant supply with her older children - now 7 and 10. Mom [facility] get [facility] DEP. She said dad did not want her to use WIC, but I explained that she would need a DEP, and could get one with WIC. Dad not present at this time. Mom was able to express some large drops of colostrum - she was pleasantly surprised by this. Mom knows to call for questions/concerns for lactation as needed.    Maternal Data Formula Feeding for Exclusion: No (baby in the NICU) Has patient been taught Hand Expression?: Yes Does the patient have breastfeeding experience prior to this delivery?: Yes  Feeding    LATCH Score/Interventions                      Lactation Tools Discussed/Used WIC Program: No (info faxed for mom to apply and get DEP) Pump Review: Setup, frequency, and cleaning;Milk Storage;Other (comment) (premie setting, hand expression, NICU booklet review) Initiated by:: chris Sabra HeckLee Rn IBCLC Date initiated:: 04/25/15 (at 6 hours post partum)   Consult Status Consult Status: Follow-up Date: 04/26/15 Follow-up type: In-patient    Alfred LevinsLee, [name] 04/25/2015, 12:23 PM

## 2015-04-25 NOTE — Anesthesia Procedure Notes (Signed)

## 2015-04-25 NOTE — H&P (Signed)
History     CSN: [id number]  Arrival date and time: 04/24/15 1315   First Provider Initiated Contact with Patient 04/24/15 1401      Chief Complaint  Patient presents with  . Contractions  . Rupture of Membranes   HPI  [name] is a 44 y.o. [id number] at 5972w4d who presents to MAU today with complaint of contraction and LOF. The patient was seen in MAU on 5/10 and had US that showed cervical funneling and shortening to 1.8 cm. Betamethasone was given x 2. Patient states that contractions have continued, even while using Procardia daily. She denies history of PTL or PTD with previous pregnancies. She had negative fern, wet prep and GC/Chlamydia 5/10 as well.   OB History    Gravida Para Term Preterm AB TAB SAB Ectopic Multiple Living   5 2 2  2  2   2       Past Medical History  Diagnosis Date  . Headache     Past Surgical History  Procedure Laterality Date  . No past surgeries      Family History  Problem Relation Age of Onset  . Hypertension Mother   . Diabetes Father   . Hypertension Sister   . Diabetes Sister   . Hypertension Brother     History  Substance Use Topics  . Smoking status: Never Smoker   . Smokeless tobacco: Not on file  . Alcohol Use: No    Allergies: No Known Allergies  Prescriptions prior to admission  Medication Sig Dispense Refill Last Dose  . NIFEdipine (PROCARDIA-XL/ADALAT CC) 60 MG 24 hr tablet Take 1 tablet (60 mg total) by mouth daily. 30 tablet 1 04/24/2015 at Unknown time  . Prenatal Vit-Fe Fumarate-FA (PRENATAL VITAMIN PO) Take 1 tablet by mouth daily.    04/23/2015 at Unknown time  . progesterone (PROMETRIUM) 200 MG capsule Place one capsule vaginally at bedtime 30 capsule 3 04/23/2015 at Unknown time  . Acetaminophen (TYLENOL PO) Take by mouth.   Past Week at Unknown time    Review of Systems  Constitutional: Negative for fever and malaise/fatigue.  Gastrointestinal: Positive for abdominal pain. Negative for  nausea, vomiting, diarrhea and constipation.  Genitourinary: Negative for dysuria, urgency and frequency.       Neg - vaginal bleeding + LOF   Physical Exam   Blood pressure 98/46, pulse 75, temperature 98.3 F (36.8 C), temperature source Oral, resp. rate 18, height 5\' 1"  (1.549 m), weight 155 lb (70.308 kg), last menstrual period 10/13/2014.  Physical Exam  Nursing note and vitals reviewed. Constitutional: She is oriented to person, place, and time. She appears well-developed and well-nourished. No distress.  HENT:  Head: Normocephalic and atraumatic.  Cardiovascular: Normal rate.   Respiratory: Effort normal.  GI: Soft. She exhibits no distension and no mass. There is no tenderness. There is no rebound and no guarding.  Genitourinary: Uterus is enlarged. Uterus is not tender. Cervix exhibits discharge (mucuous). Cervix exhibits no motion tenderness and no friability. No bleeding in the vagina. Vaginal discharge (small amount of thin, yellow discharge noted) found.  Neurological: She is alert and oriented to person, place, and time.  Skin: Skin is warm and dry. No erythema.  Psychiatric: She has a normal mood and affect.  Dilation: 1.5 Effacement (%): 50 Exam by:: Harlon FlorJ. Wenzel A  Results for orders placed or performed during the hospital encounter of 04/24/15 (from the past 24 hour(s))  Urinalysis, Routine w reflex microscopic  Status: Abnormal   Collection Time: 04/24/15  1:35 PM  Result Value Ref Range   Color, Urine YELLOW YELLOW   APPearance CLEAR CLEAR   Specific Gravity, Urine 1.010 1.005 - 1.030   pH 6.5 5.0 - 8.0   Glucose, UA NEGATIVE NEGATIVE mg/dL   Hgb urine dipstick SMALL (A) NEGATIVE   Bilirubin Urine NEGATIVE NEGATIVE   Ketones, ur NEGATIVE NEGATIVE mg/dL   Protein, ur NEGATIVE NEGATIVE mg/dL   Urobilinogen, UA 0.2 0.0 - 1.0 mg/dL   Nitrite NEGATIVE NEGATIVE   Leukocytes, UA LARGE (A) NEGATIVE  Urine microscopic-add on     Status: Abnormal   Collection  Time: 04/24/15  1:35 PM  Result Value Ref Range   Squamous Epithelial / LPF FEW (A) RARE   WBC, UA 21-50 <3 WBC/hpf   RBC / HPF 0-2 <3 RBC/hpf   Bacteria, UA MANY (A) RARE  Wet prep, genital     Status: Abnormal   Collection Time: 04/24/15  2:13 PM  Result Value Ref Range   Yeast Wet Prep HPF POC NONE SEEN NONE SEEN   Trich, Wet Prep NONE SEEN NONE SEEN   Clue Cells Wet Prep HPF POC FEW (A) NONE SEEN   WBC, Wet Prep HPF POC MANY (A) NONE SEEN  Fetal fibronectin     Status: Abnormal   Collection Time: 04/24/15  2:13 PM  Result Value Ref Range   Fetal Fibronectin POSITIVE (A) NEGATIVE    Fetal Monitoring: Baseline: 140 bpm, minimal variabilty, few 10 x 10 accelerations, no decelerations Contractions: q 4 minutes with intermittent moderate variabilty  A/P:  27 weeks.  Preterm labor.  Admit.  Magnesium sulfate.

## 2015-04-26 LAB — CBC
HCT: 30.5 % — ABNORMAL LOW (ref 36.0–46.0)
Hemoglobin: 10.4 g/dL — ABNORMAL LOW (ref 12.0–15.0)
MCH: 31.7 pg (ref 26.0–34.0)
MCHC: 34.1 g/dL (ref 30.0–36.0)
MCV: 93 fL (ref 78.0–100.0)
PLATELETS: 213 10*3/uL (ref 150–400)
RBC: 3.28 MIL/uL — AB (ref 3.87–5.11)
RDW: 13.3 % (ref 11.5–15.5)
WBC: 13.2 10*3/uL — ABNORMAL HIGH (ref 4.0–10.5)

## 2015-04-26 MED ORDER — MEASLES, MUMPS & RUBELLA VAC ~~LOC~~ INJ: 0.5000 mL | INJECTION | Freq: Once | SUBCUTANEOUS | Status: DC

## 2015-04-26 MED ORDER — BISACODYL 10 MG RE SUPP
10.0000 mg | Freq: Every day | RECTAL | Status: DC | PRN
  Administered 2015-04-26: 10 mg via RECTAL
  Filled 2015-04-26: qty 1

## 2015-04-26 MED ORDER — MEASLES, MUMPS & RUBELLA VAC ~~LOC~~ INJ
0.5000 mL | INJECTION | Freq: Once | SUBCUTANEOUS | Status: DC
  Filled 2015-04-26 (×2): qty 0.5

## 2015-04-26 NOTE — Progress Notes (Signed)
Patient ID: Kathleen Gutierrez, female   DOB: 04/29/1971, 44 y.o.   MRN: [id number] Postpartum day one Blood pressure 104/64 respiration 15 pulse 104 Fundus firm Lochia moderate Legs negative Doing well

## 2015-04-26 NOTE — Anesthesia Postprocedure Evaluation (Signed)
  Anesthesia Post-op Note  Patient: Kathleen ReamerAna Maria Gutierrez  Procedure(s) Performed: * No procedures listed *  Patient Location: PACU and Mother/Baby  Anesthesia Type:Epidural  Level of Consciousness: awake, alert  and oriented  Airway and Oxygen Therapy: Patient Spontanous Breathing  Post-op Pain: mild  Post-op Assessment: Post-op Vital signs reviewed  Post-op Vital Signs: Reviewed and stable  Last Vitals:  Filed Vitals:   04/26/15 0517  BP: 104/64  Pulse: 57  Temp: 36.4 C  Resp: 16    Complications: No apparent anesthesia complications

## 2015-04-27 NOTE — Plan of Care (Signed)
Problem: Discharge Progression Outcomes Goal: Barriers To Progression Addressed/Resolved Outcome: Completed/Met Date Met:  04/27/15 Had a BM. Goal: Activity appropriate for discharge plan Outcome: Completed/Met Date Met:  04/27/15 Tolerates walking in halls well. Goal: Tolerating diet Outcome: Completed/Met Date Met:  04/27/15 Tolerates a Regular diet well. Goal: Complications resolved/controlled Outcome: Completed/Met Date Met:  04/27/15 Voiding without difficulty.Had BM after Dulcolax suppository. Goal: Pain controlled with appropriate interventions Outcome: Completed/Met Date Met:  04/27/15 Good pain control on scheduled Motrin.

## 2015-04-27 NOTE — Discharge Summary (Signed)
Obstetric Discharge Summary Reason for Admission: onset of labor Prenatal Procedures: none Intrapartum Procedures: spontaneous vaginal delivery Postpartum Procedures: none Complications-Operative and Postpartum: none HEMOGLOBIN  Date Value Ref Range Status  04/26/2015 10.4* 12.0 - 15.0 g/dL Final   HCT  Date Value Ref Range Status  04/26/2015 30.5* 36.0 - 46.0 % Final    Physical Exam:  General: alert Lochia: appropriate Uterine Fundus: firm Incision: healing well DVT Evaluation: No evidence of DVT seen on physical exam.  Discharge Diagnoses: Premature labor  Discharge Information: Date: 04/27/2015 Activity: pelvic rest Diet: routine Medications: Percocet Condition: improved Instructions: refer to practice specific booklet Discharge to: home Follow-up Information    Follow up with Kathreen CosierMARSHALL,BERNARD A, MD.   Specialty:  Obstetrics and Gynecology   Contact information:   [address] [place] [address] [phone]       Newborn Data: Live born female  Birth Weight: 2 lb 11.4 oz (1230 g) APGAR: 2, 4  Home with baby in nicu.  MARSHALL,BERNARD A 04/27/2015, 6:27 AM

## 2015-04-27 NOTE — Progress Notes (Signed)

## 2015-04-27 NOTE — Progress Notes (Signed)
Patient ID: Kathleen Gutierrez, female   DOB: 03/31/1971, 44 y.o.   MRN: [id number] Postpartum day 2 Blood pressure 110/57 respiration 18 pulse 61 No complaints feels fine Fundus firm Lochia moderate Legs negative Home today

## 2015-04-27 NOTE — Lactation Note (Signed)
This note was copied from the chart of Kathleen Carlise Gutierrez. Lactation Consultation Note  Patient Name: Kathleen Gutierrez ZOXWR'UToday's Date: 04/27/2015 Reason for consult: Follow-up assessment  With this mom of a NICU baby, now 55 hours post partum, and [redacted] weeks gestation today. Mom was able to do skin to skin yesterday, and loved it. She sleeps with her kangaroo shirt, and she feels this has helped her milk to transition in. She  Pumped in standard setting, and within minutes had about 20 mls, and was still dripping. Mom knows to ask for lactation through her baby's nuses, as needed.   Maternal Data    Feeding    LATCH Score/Interventions                      Lactation Tools Discussed/Used     Consult Status Consult Status: PRN Follow-up type: In-patient (NICU)    Alfred LevinsLee, Denali Becvar Anne 04/27/2015, 1:24 PM

## 2015-04-27 NOTE — Discharge Instructions (Signed)
Discharge instructions ° °· You can wash your hair °· Shower °· Eat what you want °· Drink what you want °· See me in 6 weeks °· Your ankles are going to swell more in the next 2 weeks than when pregnant °· No sex for 6 weeks ° ° °Shatyra Becka A, MD 04/27/2015 ° ° °

## 2015-04-29 ENCOUNTER — Ambulatory Visit (HOSPITAL_COMMUNITY): Admission: RE | Admit: 2015-04-29 | Payer: Self-pay | Source: Ambulatory Visit

## 2015-04-29 ENCOUNTER — Other Ambulatory Visit (HOSPITAL_COMMUNITY): Payer: Self-pay

## 2015-05-02 ENCOUNTER — Ambulatory Visit (HOSPITAL_COMMUNITY): Payer: Self-pay

## 2015-06-23 ENCOUNTER — Ambulatory Visit: Payer: Self-pay

## 2015-06-23 NOTE — Lactation Note (Signed)
This note was copied from the chart of Kathleen Gutierrez. Lactation Consultation Note  Patient Name: Kathleen Gutierrez ZOXWR'UToday's Date: 06/23/2015 Reason for consult: Follow-up assessment;NICU baby NICU baby 938 weeks old. Assisted mom to latch baby in cross-cradle position to left breast. At first mom stated that she thought baby too sleepy to latch. After dribbling EBM into baby's mouth, baby willing to latch and suckle. Mom has a large nipple and an abundant supply of milk, and baby gagged a couple of times initially, but then baby started suckling rhythmically with intermittent swallows noted for about 7 minutes. Baby had to be stimulated often to keep suckling, then was re-latched and suckled for an additional 3 minutes. Mom states that she "has too much milk." Discussed with mom that having a good supply important. Discussed that when baby comes home she may need to pump or hand express a little before latching baby because she seems to have a strong let-down reflex. Discussed that baby will need to be supplemented after putting to breast when baby comes home. Enc mom to keep up her pumping to keep her good supply. Enc mom to call for assistance as needed.   Maternal Data    Feeding Feeding Type: Breast Fed Length of feed: 10 min  LATCH Score/Interventions Latch: Repeated attempts needed to sustain latch, nipple held in mouth throughout feeding, stimulation needed to elicit sucking reflex.     Type of Nipple: Everted at rest and after stimulation  Comfort (Breast/Nipple): Soft / non-tender     Hold (Positioning): No assistance needed to correctly position infant at breast.     Lactation Tools Discussed/Used     Consult Status Consult Status: PRN    Geralynn OchsWILLIARD, Leila Schuff 06/23/2015, 12:27 PM

## 2015-07-22 ENCOUNTER — Ambulatory Visit: Payer: Self-pay

## 2015-07-22 NOTE — Lactation Note (Signed)
This note was copied from the chart of [name]. Lactation Consultation Note  Patient Name: Kathleen Gutierrez Date: 07/22/2015 Reason for consult: Follow-up assessment;NICU baby   Baby 2 months old, [redacted]w[redacted]d CGA. Called to NICU to assess mom's breast. Mom's breast appear full but are still soft. Discussed mom's pumping schedule and mom states that she was pumping at least 8 times a day, but has reduced the number of times she pumps because she has been putting baby to breast. Discussed with mom that she needs to pump after baby at breast in order to remove milk because baby not able to effectively soften her breasts yet and she needs to protect her supply. Demonstrated to mom how to hand express, and mom's milk easily flowed from breast. Enc mom to hand express after pumping in order to make sure she completely softens her breasts and enc a good milk supply. After mom pumped, she states that she felt much more comfortable. Enc mom to call for assistance as needed.   Maternal Data    Feeding Feeding Type: Breast Milk Nipple Type: Slow - flow Length of feed: 25 min  LATCH Score/Interventions                      Lactation Tools Discussed/Used     Consult Status Consult Status: PRN    Geralynn Ochs 07/22/2015, 3:25 PM

## 2017-05-16 ENCOUNTER — Ambulatory Visit (INDEPENDENT_AMBULATORY_CARE_PROVIDER_SITE_OTHER): Payer: Self-pay | Admitting: Physician Assistant

## 2017-05-16 ENCOUNTER — Encounter: Payer: Self-pay | Admitting: Physician Assistant

## 2017-05-16 VITALS — BP 105/67 | HR 73 | Temp 97.8°F | Resp 18 | Ht 61.0 in | Wt 161.4 lb

## 2017-05-16 DIAGNOSIS — L255 Unspecified contact dermatitis due to plants, except food: Secondary | ICD-10-CM

## 2017-05-16 DIAGNOSIS — B351 Tinea unguium: Secondary | ICD-10-CM

## 2017-05-16 DIAGNOSIS — L237 Allergic contact dermatitis due to plants, except food: Secondary | ICD-10-CM

## 2017-05-16 MED ORDER — TRIAMCINOLONE ACETONIDE 0.5 % EX CREA: 1.0000 "application " | TOPICAL_CREAM | Freq: Two times a day (BID) | CUTANEOUS | 0 refills | Status: DC

## 2017-05-16 MED ORDER — HYDROXYZINE HCL 25 MG PO TABS: 12.5000 mg | ORAL_TABLET | Freq: Three times a day (TID) | ORAL | 0 refills | Status: DC | PRN

## 2017-05-16 MED ORDER — TERBINAFINE HCL 250 MG PO TABS: 250.0000 mg | ORAL_TABLET | Freq: Every day | ORAL | 1 refills | Status: DC

## 2017-05-16 NOTE — Patient Instructions (Addendum)
Recheck with me in 6 weeks for repeat liver enzymes     IF you received an x-ray today, you will receive an invoice from [facility]. Please contact [facility] at [phone] with questions or concerns regarding your invoice.   IF you received labwork today, you will receive an invoice from [facility]. Please contact [facility] at [phone] with questions or concerns regarding your invoice.   Our billing staff will not be able to assist you with questions regarding bills from these companies.  You will be contacted with the lab results as soon as they are available. The fastest way to get your results is to activate your My Chart account. Instructions are located on the last page of this paperwork. If you have not heard from us regarding the results in 2 weeks, please contact this office.      Poison Ivy Dermatitis Poison ivy dermatitis is redness and soreness (inflammation) of the skin. It is caused by a chemical that is found on the leaves of the poison ivy plant. You may also have itching, a rash, and blisters. Symptoms often clear up in 1-2 weeks. You may get this condition by touching a poison ivy plant. You can also get it by touching something that has the chemical on it. This may include animals or objects that have come in contact with the plant. Follow these instructions at home: General instructions  Take or apply over-the-counter and prescription medicines only as told by your doctor.  If you touch poison ivy, wash your skin with soap and cold water right away.  Use hydrocortisone creams or calamine lotion as needed to help with itching.  Take oatmeal baths as needed. Use colloidal oatmeal. You can get this at a pharmacy or grocery store. Follow the instructions on the package.  Do not scratch or rub your skin.  While you have the rash, wash your clothes right after you wear them. Prevention  Know what poison ivy looks like so you can avoid it. This  plant has three leaves with flowering branches on a single stem. The leaves are glossy. They have uneven edges that come to a point at the front.  If you have touched poison ivy, wash with soap and water right away. Be sure to wash under your fingernails.  When hiking or camping, wear long pants, a long-sleeved shirt, tall socks, and hiking boots. You can also use a lotion on your skin that helps to prevent contact with the chemical on the plant.  If you think that your clothes or outdoor gear came in contact with poison ivy, rinse them off with a garden hose before you bring them inside your house. Contact a doctor if:  You have open sores in the rash area.  You have more redness, swelling, or pain in the affected area.  You have redness that spreads beyond the rash area.  You have fluid, blood, or pus coming from the affected area.  You have a fever.  You have a rash over a large area of your body.  You have a rash on your eyes, mouth, or genitals.  Your rash does not get better after a few days. Get help right away if:  Your face swells or your eyes swell shut.  You have trouble breathing.  You have trouble swallowing. This information is not intended to replace advice given to you by your health care provider. Make sure you discuss any questions you have with your health care provider. Document Released:  01/01/2011 Document Revised: 05/06/2016 Document Reviewed: 05/07/2015 Elsevier Interactive Patient Education  [employer].

## 2017-05-16 NOTE — Progress Notes (Signed)
   Kathleen Gutierrez  MRN: [id number] DOB: 06/22/1971  PCP: Patient, No Pcp Per  Chief Complaint  Patient presents with  . 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  She has used benadryl cream and benadryl at night.  It is very itchy.  Right great toenail medial aspect yellowed and thickened - would like to know what can be done about it  Review of Systems  Constitutional: Negative for chills and fever.  Skin: Positive for rash.    Patient Active Problem List   Diagnosis Date Noted  . Pregnancy 04/25/2015  . NSVD (normal spontaneous vaginal delivery) 04/25/2015  . Preterm labor in second trimester without delivery 04/24/2015  . Cervical shortening affecting pregnancy in third trimester   . Fetal heart rate nonreactive   . [redacted] weeks gestation of pregnancy   . Evaluate anatomy not seen on prior sonogram   . [redacted] weeks gestation of pregnancy   . AMA (advanced maternal age) multigravida 35+   . Encounter for fetal anatomic survey   . [redacted] weeks gestation of pregnancy   . Advanced maternal age in multigravida     No current outpatient prescriptions on file prior to visit.   No current facility-administered medications on file prior to visit.     No Known Allergies  Pt patients past, family and social history were reviewed and updated.   Objective:  BP 105/67   Pulse 73   Temp 97.8 F (36.6 C) (Oral)   Resp 18   Ht 5\' 1"  (1.549 m)   Wt 161 lb 6.4 oz (73.2 kg)   LMP 04/15/2017   SpO2 98%   Breastfeeding? No   BMI 30.50 kg/m   Physical Exam  Constitutional: She is oriented to person, place, and time and well-developed, well-nourished, and in no distress.  HENT:  Head:  Normocephalic and atraumatic.  Right Ear: Hearing and external ear normal.  Left Ear: Hearing and external ear normal.  Eyes: Conjunctivae are normal.  Neck: Normal range of motion.  Pulmonary/Chest: Effort normal.  Neurological: She is alert and oriented to person, place, and time. Gait normal.  Skin: Skin is warm and dry. Rash (erythematous vesicular rash that has some clear crusting - confulent rash in the center of the area) noted.  Medial aspect of right great toe - thickened yellow nail  Psychiatric: Mood, memory, affect and judgment normal.  Vitals reviewed.   Assessment and Plan :  Rhus dermatitis - Plan: triamcinolone cream (KENALOG) 0.5 %, hydrOXYzine (ATARAX/VISTARIL) 25 MG tablet, Care order/instruction: - ok to continue topical cream - keep area cool as warm temperatures will make her more itchy  Onychomycosis - Plan: terbinafine (LAMISIL) 250 MG tablet, Hepatic Function Panel - check LFts today - pt to recheck with me in 6 weeks for repeat labs and then a total of 3 months of medications  Benny LennertSarah Delayla Hoffmaster PA-C  Primary Care at [facility] 05/16/2017 4:57 PM

## 2017-05-17 LAB — HEPATIC FUNCTION PANEL
ALT: 17 IU/L (ref 0–32)
AST: 18 IU/L (ref 0–40)
Albumin: 4.3 g/dL (ref 3.5–5.5)
Alkaline Phosphatase: 67 IU/L (ref 39–117)
Bilirubin Total: 0.3 mg/dL (ref 0.0–1.2)
Bilirubin, Direct: 0.09 mg/dL (ref 0.00–0.40)
TOTAL PROTEIN: 7.1 g/dL (ref 6.0–8.5)

## 2017-05-23 ENCOUNTER — Ambulatory Visit (INDEPENDENT_AMBULATORY_CARE_PROVIDER_SITE_OTHER): Payer: Self-pay | Admitting: Physician Assistant

## 2017-05-23 ENCOUNTER — Encounter: Payer: Self-pay | Admitting: Physician Assistant

## 2017-05-23 VITALS — BP 126/73 | HR 61 | Temp 97.4°F | Resp 18 | Ht 61.0 in | Wt 161.8 lb

## 2017-05-23 DIAGNOSIS — R21 Rash and other nonspecific skin eruption: Secondary | ICD-10-CM

## 2017-05-23 MED ORDER — PREDNISONE 10 MG PO TABS: ORAL_TABLET | ORAL | 0 refills | Status: AC

## 2017-05-23 MED ORDER — HYDROXYZINE HCL 25 MG PO TABS: 25.0000 mg | ORAL_TABLET | Freq: Three times a day (TID) | ORAL | 1 refills | Status: DC | PRN

## 2017-05-23 NOTE — Progress Notes (Signed)
   Kathleen Gutierrez  MRN: [id number] DOB: 11/27/1971  PCP: Patient, No Pcp Per  Chief Complaint  Patient presents with  . Rash    follow up rash is on arms and now is all over     Subjective:  Pt presents to clinic for continued rash - the rash on her arms is better but the rash on her back is worse - very itchy - using steroid cream everywhere but it is not helping much.  She has changed nothing in her home routine - she did start the lamisil but she did have the beginnings of the rash at that time.  The atarax helped with the itching but it did not resolve it - she took additional benadryl to help with her symptoms.  The rash is on her trunk and upper arms.  Review of Systems  Skin: Positive for rash.    There are no active problems to display for this patient.   Current Outpatient Prescriptions on File Prior to Visit  Medication Sig Dispense Refill  . terbinafine (LAMISIL) 250 MG tablet Take 1 tablet (250 mg total) by mouth daily. 30 tablet 1  . triamcinolone cream (KENALOG) 0.5 % Apply 1 application topically 2 (two) times daily. 45 g 0   No current facility-administered medications on file prior to visit.     No Known Allergies  Pt patients past, family and social history were reviewed and updated.   Objective:  BP 126/73   Pulse 61   Temp 97.4 F (36.3 C) (Oral)   Resp 18   Ht 5\' 1"  (1.549 m)   Wt 161 lb 12.8 oz (73.4 kg)   SpO2 97%   BMI 30.57 kg/m   Physical Exam  Constitutional: She is oriented to person, place, and time and well-developed, well-nourished, and in no distress.  HENT:  Head: Normocephalic and atraumatic.  Right Ear: Hearing and external ear normal.  Left Ear: Hearing and external ear normal.  Eyes: Conjunctivae are normal.  Neck: Normal range of motion.  Pulmonary/Chest: Effort normal.  Neurological: She is alert and oriented to person, place, and time. Gait normal.  Skin: Skin is warm and dry.  Erythematous rash on her trunk,  non-blanching - no dermatographia - pt also has rough skin consistent with atopic dermatitis skin - few scatter areas on her upper arms - spares the neck, face and legs -- rash on forearms is almost healed  Psychiatric: Mood, memory, affect and judgment normal.  Vitals reviewed.   Assessment and Plan :  Skin rash - Plan: hydrOXYzine (ATARAX/VISTARIL) 25 MG tablet, predniSONE (DELTASONE) 10 MG tablet  Unsure of cause of rash - pt will stop the Lamisil because it is a low [possibility - when the rash resolves she can restart the medication and if the rash returns we will know it was that - her atarax was refilled and the possible dose was increased to help with her itching - this could be an allergic like rash but it is also possible it is pityriasis though no Herald spot is seen - d/w both possibilities with the patient - she will recheck with me if no better in 1 week - sooner if worse  Benny LennertSarah Caliph Borowiak PA-C  Primary Care at [facility] 05/24/2017 10:01 AM

## 2017-05-23 NOTE — Progress Notes (Signed)
Subjective:    Patient ID: Kathleen Gutierrez, female    DOB: March 16, 1971, 46 y.o.   MRN: [id number] PCP: Patient, No Pcp Per Chief Complaint  Patient presents with  . Rash    follow up rash is on arms and now is all over     HPI: 46 y/o F patient presents today for follow up of rash. She was seen here on 05/16/2017 for a vesicular rash localized to the proximal forearm bilaterally. She thought she may have poison ivy from working outside and was given hydrocortisone cream and hydroxyzine. At this time she also complained of some itching under her bra line. About 3 days ago she developed a diffuse raised erythematous maculopapular rash over her trunk, buttocks, shoulder and the proximal portion of her thighs. The rash is worse under skin folds like her breast and stomach. It is severely pruritic. She has tried the hydrocortisone cream, oatmeal, calamine lotion, hydroxyzine and benadryl without relief. She denies use of antibiotics. She did start terbinafine at the time of her last visit. She denies associated fever or chills.   Past Medical History:  Diagnosis Date  . Headache    No Known Allergies     Review of Systems  Constitutional: Negative.   HENT: Negative.   Eyes: Negative.   Respiratory: Negative.  Negative for choking, chest tightness, shortness of breath and wheezing.   Cardiovascular: Negative.   Gastrointestinal: Negative.  Negative for abdominal pain, constipation, diarrhea, nausea and vomiting.  Endocrine: Negative.   Genitourinary: Negative.   Musculoskeletal: Negative.  Negative for arthralgias and neck pain.  Allergic/Immunologic: Negative.  Negative for food allergies.  Neurological: Negative.  Negative for dizziness, weakness and light-headedness.  Hematological: Negative.   Psychiatric/Behavioral: Positive for sleep disturbance (too itchy to sleep).       Objective:   Physical Exam  Constitutional: She is oriented to person, place, and time. She  appears well-developed and well-nourished. She appears distressed.  BP 126/73   Pulse 61   Temp 97.4 F (36.3 C) (Oral)   Resp 18   Ht 5\' 1"  (1.549 m)   Wt 161 lb 12.8 oz (73.4 kg)   SpO2 97%   BMI 30.57 kg/m    HENT:  Head: Normocephalic and atraumatic.  Eyes: Conjunctivae and EOM are normal. Pupils are equal, round, and reactive to light.  Neck: Normal range of motion. Neck supple.  Cardiovascular: Normal rate, regular rhythm, normal heart sounds and intact distal pulses.   Pulmonary/Chest: Effort normal and breath sounds normal.  Musculoskeletal: Normal range of motion.  Neurological: She is alert and oriented to person, place, and time.  Skin: Skin is warm, dry and intact. Rash (diffuse and raised, confined to the central part of the body, follows myoderms. no herald patch noted.. no dermatographia noted. ) noted. Rash is maculopapular and urticarial. She is not diaphoretic. No pallor.     Psychiatric: She has a normal mood and affect. Her behavior is normal. Judgment and thought content normal.          Assessment & Plan:  1. Rhus dermatitis   2. Skin rash Considered both pityriasis rosea and urticaria. Unable to exclusively diagnose with one. No dermatographia and no herald patch makes it more difficult to pinpoint the diagnosis. Treating epirically for both. Discontinue the terbinafine until rash is completely resolved. May restart when rash is gone to either rule in or out terbinafine as source of reaction.  - hydrOXYzine (ATARAX/VISTARIL) 25 MG tablet; Take 1-2  tablets (25-50 mg total) by mouth 3 (three) times daily as needed for itching.  Dispense: 60 tablet; Refill: 1 - predniSONE (DELTASONE) 10 MG tablet; 6-1 taper - Start with 6 pills on the 1st day and decreased each day by one pill.  Take pills for that day all together in the am with food.  Dispense: 21 tablet; Refill: 0  Follow up if you develop worsening rash, difficulty breath, swelling in face, mouth or  throat.

## 2017-05-23 NOTE — Patient Instructions (Addendum)
Stop the medication for your toenail until the rash is gone and then you can restart it after that but if the rash returns we will know it was that medication    IF you received an x-ray today, you will receive an invoice from [facility]. Please contact [facility] at [phone] with questions or concerns regarding your invoice.   IF you received labwork today, you will receive an invoice from [facility]. Please contact [facility] at [phone] with questions or concerns regarding your invoice.   Our billing staff will not be able to assist you with questions regarding bills from these companies.  You will be contacted with the lab results as soon as they are available. The fastest way to get your results is to activate your My Chart account. Instructions are located on the last page of this paperwork. If you have not heard from us regarding the results in 2 weeks, please contact this office.   Pityriasis Rosea Pityriasis rosea is a rash that usually appears on the trunk of the body. It may also appear on the upper arms and upper legs. It usually begins as a single patch, and then more patches begin to develop. The rash may cause mild itching, but it normally does not cause other problems. It usually goes away without treatment. However, it may take weeks or months for the rash to go away completely. What are the causes? The cause of this condition is not known. The condition does not spread from person to person (is noncontagious). What increases the risk? This condition is more likely to develop in young adults and children. It is most common in the spring and fall. What are the signs or symptoms? The main symptom of this condition is a rash.  The rash usually begins with a single oval patch that is larger than the ones that follow. This is called a herald patch. It generally appears a week or more before the rest of the rash appears.  When more patches start to develop,  they spread quickly on the trunk, back, and arms. These patches are smaller than the first one.  The patches that make up the rash are usually oval-shaped and pink or red in color. They are usually flat, but they may sometimes be raised so that they can be felt with a finger. They may also be finely crinkled and have a scaly ring around the edge.  The rash does not typically appear on areas of the skin that are exposed to the sun.  Most people who have this condition do not have other symptoms, but some have mild itching. In a few cases, a mild headache or body aches may occur before the rash appears and then go away. How is this diagnosed? Your health care provider may diagnose this condition by doing a physical exam and taking your medical history. To rule out other possible causes for the rash, the health care provider may order blood tests or take a skin sample from the rash to be looked at under a microscope. How is this treated? Usually, treatment is not needed for this condition. The rash will probably go away on its own in 4-8 weeks. In some cases, a health care provider may recommend or prescribe medicine to reduce itching. Follow these instructions at home:  Take medicines only as directed by your health care provider.  Avoid scratching the affected areas of skin.  Do not take hot baths or use a sauna. Use only warm  water when bathing or showering. Heat can increase itching. Contact a health care provider if:  Your rash does not go away in 8 weeks.  Your rash gets much worse.  You have a fever.  You have swelling or pain in the rash area.  You have fluid, blood, or pus coming from the rash area. This information is not intended to replace advice given to you by your health care provider. Make sure you discuss any questions you have with your health care provider. Document Released: 01/05/2002 Document Revised: 05/06/2016 Document Reviewed: 11/06/2014 Elsevier Interactive  Patient Education  [employer].

## 2017-06-28 ENCOUNTER — Encounter: Payer: Self-pay | Admitting: Physician Assistant

## 2017-06-28 ENCOUNTER — Ambulatory Visit (INDEPENDENT_AMBULATORY_CARE_PROVIDER_SITE_OTHER): Payer: Self-pay | Admitting: Physician Assistant

## 2017-06-28 VITALS — BP 111/73 | HR 69 | Temp 97.9°F | Resp 18 | Ht 61.0 in | Wt 164.0 lb

## 2017-06-28 DIAGNOSIS — Z5181 Encounter for therapeutic drug level monitoring: Secondary | ICD-10-CM

## 2017-06-28 DIAGNOSIS — B351 Tinea unguium: Secondary | ICD-10-CM

## 2017-06-28 MED ORDER — TERBINAFINE HCL 250 MG PO TABS: 250.0000 mg | ORAL_TABLET | Freq: Every day | ORAL | 0 refills | Status: DC

## 2017-06-28 NOTE — Patient Instructions (Addendum)
  Finish the next month of medication.  I will call you if you should not continue based on your lab results.   IF you received an x-ray today, you will receive an invoice from [facility]. Please contact [facility] at [phone] with questions or concerns regarding your invoice.   IF you received labwork today, you will receive an invoice from [facility]. Please contact [facility] at [phone] with questions or concerns regarding your invoice.   Our billing staff will not be able to assist you with questions regarding bills from these companies.  You will be contacted with the lab results as soon as they are available. The fastest way to get your results is to activate your My Chart account. Instructions are located on the last page of this paperwork. If you have not heard from us regarding the results in 2 weeks, please contact this office.

## 2017-06-28 NOTE — Progress Notes (Signed)
   Kathleen Gutierrez  MRN: [id number] DOB: 08/22/1971  PCP: Patient, No Pcp Per  Chief Complaint  Patient presents with  . Nail Problem    follow up    Subjective:  Pt presents to clinic for medication refill.  She went to the pharmacy and she did not have any further refills and she knows she needs to be on it for 12 weeks.  She forgot she had to have her liver enzymes evaluated again.  She feels fine on the medication.  She feels like her new nail growth is better.  History is obtained by patient.  Review of Systems  Gastrointestinal: Negative for abdominal pain and nausea.  Skin: Negative for wound.    There are no active problems to display for this patient.   No current outpatient prescriptions on file prior to visit.   No current facility-administered medications on file prior to visit.     No Known Allergies  Past Medical History:  Diagnosis Date  . Headache    Social History   Social History Narrative  . No narrative on file   Social History  Substance Use Topics  . Smoking status: Never Smoker  . Smokeless tobacco: Never Used  . Alcohol use No   family history includes Diabetes in her father and sister; Hypertension in her brother, mother, and sister.     Objective:  BP 111/73   Pulse 69   Temp 97.9 F (36.6 C) (Oral)   Resp 18   Ht 5\' 1"  (1.549 m)   Wt 164 lb (74.4 kg)   LMP 06/07/2017   SpO2 97%   BMI 30.99 kg/m  Body mass index is 30.99 kg/m.  Physical Exam  Constitutional: She is oriented to person, place, and time and well-developed, well-nourished, and in no distress.  HENT:  Head: Normocephalic and atraumatic.  Right Ear: Hearing and external ear normal.  Left Ear: Hearing and external ear normal.  Eyes: Conjunctivae are normal.  Neck: Normal range of motion.  Pulmonary/Chest: Effort normal.  Neurological: She is alert and oriented to person, place, and time. Gait normal.  Skin: Skin is warm and dry.  New nail growth on  the right great toenail is within normal appearance.  Psychiatric: Mood, memory, affect and judgment normal.  Vitals reviewed.   Assessment and Plan :  Onychomycosis - Plan: terbinafine (LAMISIL) 250 MG tablet, Hepatic Function Panel  Medication monitoring encounter - Plan: Hepatic Function Panel  Benny LennertSarah Chelsa Stout PA-C  Primary Care at [facility] 06/28/2017 4:45 PM

## 2017-06-29 LAB — HEPATIC FUNCTION PANEL
ALBUMIN: 4.4 g/dL (ref 3.5–5.5)
ALT: 15 IU/L (ref 0–32)
AST: 18 IU/L (ref 0–40)
Alkaline Phosphatase: 70 IU/L (ref 39–117)
Bilirubin Total: <0.2 mg/dL (ref 0.0–1.2)
Bilirubin, Direct: 0.07 mg/dL (ref 0.00–0.40)
TOTAL PROTEIN: 7.1 g/dL (ref 6.0–8.5)

## 2017-07-01 ENCOUNTER — Telehealth: Payer: Self-pay | Admitting: Physician Assistant

## 2017-07-01 NOTE — Telephone Encounter (Signed)
Pt is asking if she was only supposed to have the medication that was prescribed for her to take for 6 weeks & the pharmacy only gave her 30 pills & she was just wondering if she needed to be seen once the pills were done or if she needed to make them last up until the 6 week f/u. The medication is lamisil   Please Advise

## 2017-07-05 NOTE — Telephone Encounter (Signed)
According to the most recent progress note from [facility]. Stated that patient would get a call from the provider if she should not continue the medication, based on her labs, after she finished the month worth of medication.

## 2017-07-19 ENCOUNTER — Ambulatory Visit: Payer: Self-pay | Admitting: Physician Assistant

## 2017-07-26 ENCOUNTER — Ambulatory Visit (INDEPENDENT_AMBULATORY_CARE_PROVIDER_SITE_OTHER): Payer: Self-pay | Admitting: Physician Assistant

## 2017-07-26 ENCOUNTER — Encounter: Payer: Self-pay | Admitting: Physician Assistant

## 2017-07-26 VITALS — BP 111/70 | HR 59 | Temp 98.0°F | Resp 18 | Ht 61.0 in | Wt 163.0 lb

## 2017-07-26 DIAGNOSIS — N3000 Acute cystitis without hematuria: Secondary | ICD-10-CM

## 2017-07-26 DIAGNOSIS — R300 Dysuria: Secondary | ICD-10-CM

## 2017-07-26 LAB — GLUCOSE, POCT (MANUAL RESULT ENTRY): POC GLUCOSE: 89 mg/dL (ref 70–99)

## 2017-07-26 LAB — POCT URINALYSIS DIP (MANUAL ENTRY)
BILIRUBIN UA: NEGATIVE
GLUCOSE UA: NEGATIVE mg/dL
Ketones, POC UA: NEGATIVE mg/dL
NITRITE UA: NEGATIVE
Protein Ur, POC: NEGATIVE mg/dL
Spec Grav, UA: <=1.005 — AB (ref 1.010–1.025)
UROBILINOGEN UA: 0.2 U/dL
pH, UA: 6.5 (ref 5.0–8.0)

## 2017-07-26 MED ORDER — NITROFURANTOIN MONOHYD MACRO 100 MG PO CAPS: 100.0000 mg | ORAL_CAPSULE | Freq: Two times a day (BID) | ORAL | 0 refills | Status: AC

## 2017-07-26 NOTE — Patient Instructions (Signed)
     IF you received an x-ray today, you will receive an invoice from [facility]. Please contact [facility] at [phone] with questions or concerns regarding your invoice.   IF you received labwork today, you will receive an invoice from [facility]. Please contact [facility] at [phone] with questions or concerns regarding your invoice.   Our billing staff will not be able to assist you with questions regarding bills from these companies.  You will be contacted with the lab results as soon as they are available. The fastest way to get your results is to activate your My Chart account. Instructions are located on the last page of this paperwork. If you have not heard from us regarding the results in 2 weeks, please contact this office.     

## 2017-07-26 NOTE — Progress Notes (Signed)
Kathleen Gutierrez  MRN: [id number] DOB: 22-Jul-1971  PCP: Morrell Riddle, PA-C  Chief Complaint  Patient presents with  . Follow-up  . Dysuria    pt states mostly in the morning     Subjective:  Pt presents to clinic for dysuria  Burning in the morning when she does to the bathroom. This started 1 week ago, disappears for a couple of days, then now its back. She describes it as a burning sensation. She has drinking ginger in the mornings. She is drinking 8x8ounce bottles a day. Nothing makes it worse. It happens more in the mornings. Urine color - yellow and has a sweet odor.  No vaginal symptoms.  Has had UTIs in the past and this feels like it has in the past. New copper IUD in January.   History is obtained by patient.  Review of Systems  Constitutional: Negative for chills and fever.  Gastrointestinal: Negative for abdominal pain, constipation, diarrhea, nausea and vomiting.  Genitourinary: Positive for dyspareunia, dysuria, flank pain, frequency and urgency. Negative for decreased urine volume, difficulty urinating, hematuria, pelvic pain, vaginal bleeding and vaginal discharge.  Musculoskeletal: Negative for back pain.    There are no active problems to display for this patient.   Current Outpatient Prescriptions on File Prior to Visit  Medication Sig Dispense Refill  . terbinafine (LAMISIL) 250 MG tablet Take 1 tablet (250 mg total) by mouth daily. 30 tablet 0   No current facility-administered medications on file prior to visit.     No Known Allergies  Past Medical History:  Diagnosis Date  . Headache    Social History   Social History Narrative   Does not worked - stays at home with 22 year old daughter.   Social History  Substance Use Topics  . Smoking status: Never Smoker  . Smokeless tobacco: Never Used  . Alcohol use No   family history includes Diabetes in her father and sister; Hypertension in her brother, mother, and sister.       Objective:  BP 111/70   Pulse (!) 59   Temp 98 F (36.7 C) (Oral)   Resp 18   Ht 5\' 1"  (1.549 m)   Wt 163 lb (73.9 kg)   LMP 07/13/2017   SpO2 98%   BMI 30.80 kg/m  Body mass index is 30.8 kg/m.  Physical Exam  Constitutional: She is oriented to person, place, and time and well-developed, well-nourished, and in no distress.  HENT:  Head: Normocephalic and atraumatic.  Right Ear: External ear normal.  Left Ear: External ear normal.  Cardiovascular: Normal rate, regular rhythm and normal heart sounds.   No murmur heard. Pulmonary/Chest: Effort normal and breath sounds normal.  Abdominal: Soft. Bowel sounds are normal. There is no tenderness. There is no CVA tenderness.  Neurological: She is alert and oriented to person, place, and time. Gait normal.  Skin: Skin is warm and dry.  Psychiatric: Mood, memory, affect and judgment normal.  Vitals reviewed.  Results for orders placed or performed in visit on 07/26/17  POCT urinalysis dipstick  Result Value Ref Range   Color, UA yellow yellow   Clarity, UA cloudy (A) clear   Glucose, UA negative negative mg/dL   Bilirubin, UA negative negative   Ketones, POC UA negative negative mg/dL   Spec Grav, UA <=4.098 (A) 1.010 - 1.025   Blood, UA small (A) negative   pH, UA 6.5 5.0 - 8.0   Protein Ur, POC negative negative mg/dL  Urobilinogen, UA 0.2 0.2 or 1.0 E.U./dL   Nitrite, UA Negative Negative   Leukocytes, UA Small (1+) (A) Negative  POCT glucose (manual entry)  Result Value Ref Range   POC Glucose 89 70 - 99 mg/dl    Assessment and Plan :  Dysuria - Plan: POCT urinalysis dipstick, POCT glucose (manual entry)  Acute cystitis without hematuria - Plan: nitrofurantoin, macrocrystal-monohydrate, (MACROBID) 100 MG capsule, Urine Culture - treat for UTI - send for urine culture  Benny LennertSarah Weber PA-C  [facility] at [facility] 07/26/2017 5:41 PM

## 2017-07-26 NOTE — Progress Notes (Signed)
   Alinda Deemna Maria Castro-Guerrero  MRN: [id number] DOB: 10/22/1971  PCP: Patient, No Pcp Per  Chief Complaint  Patient presents with  . Follow-up  . Dysuria    pt states mostly in the morning     Subjective:  Pt presents to clinic for  History is obtained by .  Review of Systems  There are no active problems to display for this patient.   Current Outpatient Prescriptions on File Prior to Visit  Medication Sig Dispense Refill  . terbinafine (LAMISIL) 250 MG tablet Take 1 tablet (250 mg total) by mouth daily. 30 tablet 0   No current facility-administered medications on file prior to visit.     No Known Allergies  Past Medical History:  Diagnosis Date  . Headache    Social History   Social History Narrative  . No narrative on file   Social History  Substance Use Topics  . Smoking status: Never Smoker  . Smokeless tobacco: Never Used  . Alcohol use No   family history includes Diabetes in her father and sister; Hypertension in her brother, mother, and sister.     Objective:  BP 111/70   Pulse (!) 59   Temp 98 F (36.7 C) (Oral)   Resp 18   Ht 5\' 1"  (1.549 m)   Wt 163 lb (73.9 kg)   LMP 07/13/2017   SpO2 98%   BMI 30.80 kg/m  Body mass index is 30.8 kg/m.  Physical Exam  Assessment and Plan :  Dysuria - Plan: POCT urinalysis dipstick  Benny LennertSarah Ratasha Fabre PA-C  [facility] at [facility] 07/26/2017 4:40 PM

## 2017-07-28 LAB — URINE CULTURE

## 2017-08-09 ENCOUNTER — Ambulatory Visit: Payer: Self-pay | Admitting: Physician Assistant

## 2017-10-24 ENCOUNTER — Ambulatory Visit: Payer: Self-pay | Admitting: Urgent Care

## 2017-10-24 ENCOUNTER — Encounter: Payer: Self-pay | Admitting: Urgent Care

## 2017-10-24 VITALS — BP 109/66 | HR 61 | Temp 98.2°F | Resp 16 | Ht 61.0 in | Wt 166.4 lb

## 2017-10-24 DIAGNOSIS — R5383 Other fatigue: Secondary | ICD-10-CM

## 2017-10-24 LAB — POCT CBC
GRANULOCYTE PERCENT: 75.9 % (ref 37–80)
HCT, POC: 42.2 % (ref 37.7–47.9)
HEMOGLOBIN: 14.6 g/dL (ref 12.2–16.2)
Lymph, poc: 1.6 (ref 0.6–3.4)
MCH, POC: 30.4 pg (ref 27–31.2)
MCHC: 34.6 g/dL (ref 31.8–35.4)
MCV: 87.9 fL (ref 80–97)
MID (CBC): 0.3 (ref 0–0.9)
MPV: 8.1 fL (ref 0–99.8)
PLATELET COUNT, POC: 243 10*3/uL (ref 142–424)
POC Granulocyte: 5.8 (ref 2–6.9)
POC LYMPH PERCENT: 20.6 %L (ref 10–50)
POC MID %: 3.5 % (ref 0–12)
RBC: 4.8 M/uL (ref 4.04–5.48)
RDW, POC: 12.4 %
WBC: 7.7 10*3/uL (ref 4.6–10.2)

## 2017-10-24 LAB — POCT GLYCOSYLATED HEMOGLOBIN (HGB A1C): Hemoglobin A1C: 6.1

## 2017-10-24 NOTE — Patient Instructions (Addendum)
Fatiga (Fatigue) La fatiga es una sensacin de cansancio en todo momento, falta de energa o falta de motivacin. La fatiga ocasional o leve con frecuencia es una reaccin normal a la actividad o la vida en general. Sin embargo, la fatiga de Set designerlarga duracin (crnica) o extrema puede indicar una enfermedad preexistente. INSTRUCCIONES PARA EL CUIDADO EN EL HOGAR Controle su fatiga para ver si hay cambios. Las siguientes indicaciones ayudarn a Psychologist, educationalaliviar cualquier Longs Drug Storesmolestia que pueda sentir:  Hable con el mdico acerca de cunto debe dormir cada noche. Trate de dormir la cantidad de tiempo requerida todas las noches.  Tome los medicamentos solamente como se lo haya indicado el mdico.  Siga una dieta saludable y nutritiva. Pida ayuda al mdico si necesita hacer cambios en su dieta.  Beba suficiente lquido para Photographermantener la orina clara o de color amarillo plido.  Practique actividades que lo relajen, como yoga, meditacin, terapia de Paskentamasajes o acupuntura.  Haga ejercicios regularmente.  Cambie las situaciones que le provocan estrs. Trate de que su Guamrutina de trabajo y personal sea moderada.  No consuma drogas.  Limite el consumo de alcohol a no ms de 1 medida por da si es mujer y no est Orthoptistembarazada, y 2 medidas si es hombre. Una medida equivale a 12onzas de cerveza, 5onzas de vino o 1onzas de bebidas alcohlicas de alta graduacin.  Tome una multivitamina, si se lo indic el mdico. SOLICITE ATENCIN MDICA SI:  La fatiga no mejora.  Tiene fiebre.  Tiene prdida o aumento involuntario de [place]peso.  Tiene dolores de Turkmenistancabeza.  Tiene dificultad para: ? Dormirse. ? Dormir durante toda la noche.  Se siente enojado, culpable, ansioso o triste.  No puede defecar (estreimiento).  Tiene la piel seca.  Tiene hinchadas las piernas u otra parte del cuerpo. SOLICITE ATENCIN MDICA DE INMEDIATO SI:  Se siente confundido.  Tiene visin borrosa.  Sufre mareos o se desmaya.  Sufre un  dolor intenso de Turkmenistancabeza.  Siente dolor intenso en el abdomen, la pelvis o la espalda.  Tiene dolor de pecho, dificultad para respirar, o latidos cardacos irregulares o acelerados.  No puede orinar u Omnicareorina menos de lo normal.  Presenta sangrado anormal, como sangrado del recto, la vagina, la nariz, los pulmones o los pezones.  Vomita sangre.  Tiene pensamientos acerca de hacerse dao a s mismo o cometer suicidio.  Le preocupa la posibilidad de hacerle dao a otra persona. Esta informacin no tiene Theme park managercomo fin reemplazar el consejo del mdico. Asegrese de hacerle al mdico cualquier pregunta que tenga. Document Released: 03/17/2009 Document Revised: 03/22/2016 Document Reviewed: 04/02/2014 Elsevier Interactive Patient Education  2018 ArvinMeritorElsevier Inc.     Prevencin de la diabetes mellitus tipo2 (Preventing Type 2 Diabetes Mellitus) La diabetes tipo2 (diabetes mellitus tipo2) es una enfermedad a largo plazo (crnica) que afecta los niveles de azcar en la sangre (glucosa). Normalmente, una hormona llamada insulina estimula el ingreso de la glucosa en las clulas del cuerpo. Las clulas usan la glucosa para Psychiatristobtener energa. En la diabetes tipo2, puede presentarse uno de los [address] Streetsiguientes problemas, o ambos:  El organismo no produce la cantidad suficiente de Salamoniainsulina.  El organismo no responde de Nicaraguamanera adecuada a la insulina que produce (resistencia a la insulina). La resistencia a la insulina o la falta de esta hormona hace que el exceso de glucosa se acumule en la sangre, en lugar de ir a las clulas. Como consecuencia, se desarrolla glucemia alta (hiperglucemia), que puede causar muchas complicaciones. El sobrepeso o la obesidad,  y llevar un estilo de vida inactivo (sedentario) pueden aumentar el riesgo de tener diabetes. La diabetes tipo2 se puede retardar o evitar al realizar ciertos cambios en la alimentacin y en el estilo de vida. QU CAMBIOS EN LA ALIMENTACIN SE PUEDEN  HACER?  Consuma comidas y colaciones saludables regularmente. Lleve con usted una colacin saludable para cuando tenga [employer], por [place], una fruta o un puado de frutos secos.  Coma carne [place] y protenas con bajo contenido de grasas saturadas, como pollo, pescado, huevos blancos y frijoles. Evite las carnes procesadas.  Coma mucha fruta y verdura, y Neomia Dear cantidad importante de cereales no procesados (cereales integrales). Se recomienda que consuma lo siguiente: ? De 1 a 2tazas de frutas CarMax. ? De 2 a 3tazas de Sanmina-SCI. ? Jordan Hawks (170g) a 8onzas (227g) de cereales integrales todos los [place], como avena, [place], trigo [place], arroz integral, quinua y mijo.  Productos lcteos con bajo contenido de [place], [place], yogur y [place].  Alimentos que contengan grasas saludables, como frutos secos, Chartered certified accountant, aceite de [place] y aceite de canola.  Beba agua durante todo Medical laboratory scientific officer. Evite bebidas que contengan ms azcar, como gaseosas y t [place].  Siga las indicaciones del mdico con respecto a las restricciones especficas para las comidas o bebidas.  Controle la cantidad de comida que consume en un momento dado (tamao de la porcin). ? Revise las etiquetas de los alimentos para conocer el tamao de la porcin. ? Utilice una balanza de cocina para pesar las cantidades de alimentos.  Saltee o cocine al vapor los alimentos en vez de frerlos. Cocine con agua o caldo en vez de aceite o manteca.  Limite la ingesta de lo siguiente: ? Sal (sodio). No consuma ms de 1cucharadita (2400mg ) de sodio por da. Si tiene [place] cardiopata o hipertensin arterial, consuma menos de  o de cucharadita (1500mg ) de sodio por da. ? Grasas saturadas. Es la grasa que se encuentra en estado slido a temperatura ambiente, como la [place] o la grasa de la carne. QU CAMBIOS EN EL ESTILO DE VIDA SE PUEDEN HACER? Actividad  Haga actividad fsica de  intensidad moderada durante al menos como mnimo 5das por semana, o tanto como le haya indicado el mdico.  Pregntele al mdico qu actividades son seguras para usted. Una combinacin de actividades puede ser la mejor opcin, por ejemplo, caminar, practicar natacin, andar en bicicleta y hacer entrenamiento de fuerza.  Trate de agregar la actividad fsica a Journalist, newspaper. Por ejemplo: ? Estacione en lugares que estn ms alejados de lo habitual para poder caminar ms. Por ejemplo, estacione en una esquina alejada del estacionamiento cuando vaya a la oficina o a la tienda de comestibles. ? D una caminata durante su hora de almuerzo. ? Utilice las Microbiologist del ascensor o de las escaleras mecnicas. Prdida de peso  Baje de peso segn se le indique. El mdico puede determinar cuntos kilos tiene que bajar y [employer] a que adelgace de Wellsite geologist segura.  Si tiene sobrepeso u obesidad, es posible que se le indique bajar, por lo menos del 5% al 7% del Runner, broadcasting/film/video. Alcohol y tabaco  Limite el consumo de alcohol a no ms de 1 medida por da si es mujer y no est Orthoptist, y 2 medidas por da si es hombre. Una medida equivale a 12onzas de cerveza, 5onzas de vino o 1onzas de bebidas alcohlicas de alta graduacin.  No consuma ningn producto que  contenga tabaco, lo que incluye cigarrillos, tabaco de Theatre managermascar y Administrator, Civil Servicecigarrillos electrnicos. Si necesita ayuda para dejar de fumar, consulte al American Expressmdico. Coopere con el mdico  Contrlese el nivel sanguneo de glucosa con frecuencia como se lo haya indicado el mdico.  Analice los factores de riesgo y cmo puede reducir el riesgo de tener diabetes.  Hgase las pruebas de Lowe's Companiesdeteccin como se lo haya indicado el mdico. Puede hacerse pruebas de deteccin de forma peridica, especialmente si presenta ciertos factores de riesgo para la diabetes tipo2.  Haga una cita con un especialista en alimentacin y nutricin (nutricionista certificado). Un  nutricionista certificado puede ayudarlo a preparar un plan de alimentacin saludable, y a comprender los tamaos de las porciones y las etiquetas de los alimentos. POR QU ESTOS CAMBIOS SON IMPORTANTES?  Al hacer cambios en el estilo de vida y la alimentacin, es posible prevenir o retardar la diabetes tipo2 y los problemas de salud relacionados.  Puede ser difcil reconocer los signos de la diabetes tipo2. La mejor manera de evitar los posibles daos al organismo es tomar medidas para prevenir la enfermedad antes de presentar sntomas. QU PUEDE SUCEDER SI NO SE REALIZAN CAMBIOS?  Los niveles sanguneos de glucosa pueden seguir aumentando. Es peligroso Public librariantener la glucemia alta durante mucho tiempo. Demasiada glucosa en la sangre puede daar los vasos sanguneos, el corazn, los riones, los nervios y los ojos.  Puede desarrollar prediabetes o diabetes tipo2. La diabetes tipo2 puede producir muchos problemas de salud crnicos y complicaciones, por ejemplo: ? Cardiopata. ? Ictus. ? Ceguera. ? Enfermedad renal. ? Depresin. ? Mala Harrah's Entertainmentcirculacin en los pies y en las piernas, que podra llevar a la extraccin quirrgica (amputacin) en casos graves. DNDE ENCONTRAR ASISTENCIA:  Pdale al mdico que le recomiende a un nutricionista certificado, a IT trainerun instructor para el cuidado de la diabetes o un programa para Publishing copybajar de [place]peso.  Busque grupos para bajar de peso locales o en lnea.  Inscrbase en un gimnasio, club de preparacin fsica o grupo de actividades al OGE Energyaire libre, [place]como un club para salir a Advertising account plannercaminar. DNDE ENCONTRAR MS INFORMACIN: Para obtener ms informacin sobre la diabetes y la prevencin de la diabetes, visite los siguientes sitios web:  Asociacin Americana de la Diabetes (American Diabetes Association, ADA): www.diabetes.org  The Krogernstituto Nacional de la Diabetes y las Enfermedades Digestivas y [facility](National Institute of Diabetes and Digestive and Kidney Diseases):  ToyArticles.cawww.niddk.nih.gov/health-information/diabetes Para obtener ms informacin sobre una alimentacin saludable, visite los siguientes sitios web:  Choose My Plate (MiPlato), Departamento de Agricultura de EE.UU. (U.S. Department of Agriculture, Medical sales representativeUSDA): http://yates.biz/www.choosemyplate.gov/food-groups  Coca ColaSeccin Dietary Guidelines (Pautas de Paediatric nursealimentacin) de la Oficina de Prevencin de Enfermedades y Promocin de Radiographer, therapeuticla Salud (Office of Disease Prevention and Health Promotion, [place]ODPHP): ListingMagazine.siwww.health.gov/dietaryguidelines Resumen  Puede reducir el riesgo de desarrollar diabetes tipo2 al aumentar la actividad fsica, comer alimentos saludables y Publishing copybajar de [place]peso, segn se le indique.  Hable con el mdico sobre el riesgo de desarrollar diabetes tipo2. Pregntele Amgen Incsobre los anlisis de sangre o las pruebas de deteccin que deba Prairie Viewhacerse. Esta informacin no tiene Theme park managercomo fin reemplazar el consejo del mdico. Asegrese de hacerle al mdico cualquier pregunta que tenga. Document Released: 01/20/2016 Document Revised: 01/20/2016 Document Reviewed: 01/20/2016 Elsevier Interactive Patient Education  2018 ArvinMeritorElsevier Inc.     IF you received an x-ray today, you will receive an invoice from [facility]. Please contact [facility] at [phone] with questions or concerns regarding your invoice.   IF you received labwork today, you will receive an invoice  from [facility]. Please contact [facility] at [phone] with questions or concerns regarding your invoice.   Our billing staff will not be able to assist you with questions regarding bills from these companies.  You will be contacted with the lab results as soon as they are available. The fastest way to get your results is to activate your My Chart account. Instructions are located on the last page of this paperwork. If you have not heard from [place] regarding the results in 2 weeks, please contact this office.

## 2017-10-24 NOTE — Progress Notes (Signed)
MRN: [id number] DOB: 04/25/1971  Subjective:   Kathleen Gutierrez is a 46 y.o. female presenting for 1.5 week history of fatigue, worse at night. Also reports that she has headaches, low back discomfort. Reports polydipsia, polyuria. Feels lonely even though she has her husband and 2 sons, feels irritable, down. Does not have any family nearby. Admits that she has regular bowel movements and does not have constipation. Has had intermittent breast pain, hot flashes. Has an appointment with a gynecologist in December 2018. Has been exercising but has not been able to continue going due to her fatigue. Denies chest pain, n/v, abdominal pain. Had a baby in 2016, had a baby girl, was born prematurely, has been doing well since then. Denies smoking cigarettes.  [name] is taking omega-3 fatty acids and has No Known Allergies.  [name]  has a past medical history of Headache. Denies past surgical history. Her family history includes Diabetes in her father and sister; Hypertension in her brother, mother, and sister.   Objective:   Vitals: BP 109/66   Pulse 61   Temp 98.2 F (36.8 C) (Oral)   Resp 16   Ht 5\' 1"  (1.549 m)   Wt 166 lb 6.4 oz (75.5 kg)   SpO2 95%   BMI 31.44 kg/m   Wt Readings from Last 3 Encounters:  10/24/17 166 lb 6.4 oz (75.5 kg)  07/26/17 163 lb (73.9 kg)  06/28/17 164 lb (74.4 kg)    Physical Exam  Constitutional: She is oriented to person, place, and time. She appears well-developed and well-nourished.  HENT:  Mouth/Throat: Oropharynx is clear and moist.  Eyes: Right eye exhibits no discharge. Left eye exhibits no discharge.  Neck: Normal range of motion. Neck supple. No thyromegaly present.  Cardiovascular: Normal rate, regular rhythm and intact distal pulses. Exam reveals no gallop and no friction rub.  No murmur heard. Pulmonary/Chest: No respiratory distress. She has no wheezes. She has no rales.  Abdominal: Soft. Bowel sounds are normal. She exhibits no distension  and no mass. There is no tenderness. There is no guarding.  Musculoskeletal: She exhibits no edema.  Neurological: She is alert and oriented to person, place, and time.  Skin: Skin is warm and dry.   Results for orders placed or performed in visit on 10/24/17 (from the past 24 hour(s))  POCT CBC     Status: None   Collection Time: 10/24/17 11:02 AM  Result Value Ref Range   WBC 7.7 4.6 - 10.2 K/uL   Lymph, poc 1.6 0.6 - 3.4   POC LYMPH PERCENT 20.6 10 - 50 %L   MID (cbc) 0.3 0 - 0.9   POC MID % 3.5 0 - 12 %M   POC Granulocyte 5.8 2 - 6.9   Granulocyte percent 75.9 37 - 80 %G   RBC 4.80 4.04 - 5.48 M/uL   Hemoglobin 14.6 12.2 - 16.2 g/dL   HCT, POC 40.942.2 81.137.7 - 47.9 %   MCV 87.9 80 - 97 fL   MCH, POC 30.4 27 - 31.2 pg   MCHC 34.6 31.8 - 35.4 g/dL   RDW, POC 91.412.4 %   Platelet Count, POC 243 142 - 424 K/uL   MPV 8.1 0 - 99.8 fL  POCT glycosylated hemoglobin (Hb A1C)     Status: None   Collection Time: 10/24/17 11:04 AM  Result Value Ref Range   Hemoglobin A1C 6.1    Assessment and Plan :   1. Other fatigue 2. Decreased energy -  Labs pending. Patient has pre-diabetes. Recommended dietary modifications. Will establish follow up after labs come back.  3. Need for influenza vaccination - Flu Vaccine QUAD 6+ mos PF IM (Fluarix Quad PF)   Wallis BambergMario Belicia Difatta, PA-C [facility] [phone] 10/24/2017  10:44 AM

## 2017-10-25 LAB — BASIC METABOLIC PANEL
BUN/Creatinine Ratio: 15 (ref 9–23)
BUN: 10 mg/dL (ref 6–24)
CALCIUM: 9.3 mg/dL (ref 8.7–10.2)
CO2: 21 mmol/L (ref 20–29)
CREATININE: 0.67 mg/dL (ref 0.57–1.00)
Chloride: 102 mmol/L (ref 96–106)
GFR, EST AFRICAN AMERICAN: 124 mL/min/{1.73_m2} (ref >59)
GFR, EST NON AFRICAN AMERICAN: 107 mL/min/{1.73_m2} (ref >59)
Glucose: 91 mg/dL (ref 65–99)
POTASSIUM: 4.1 mmol/L (ref 3.5–5.2)
Sodium: 139 mmol/L (ref 134–144)

## 2017-10-25 LAB — THYROID PANEL WITH TSH
Free Thyroxine Index: 1.5 (ref 1.2–4.9)
T3 UPTAKE RATIO: 24 % (ref 24–39)
T4 TOTAL: 6.1 ug/dL (ref 4.5–12.0)
TSH: 1.64 u[IU]/mL (ref 0.450–4.500)

## 2018-04-06 ENCOUNTER — Other Ambulatory Visit: Payer: Self-pay

## 2018-04-06 ENCOUNTER — Ambulatory Visit: Payer: Self-pay | Admitting: Urgent Care

## 2018-04-06 ENCOUNTER — Encounter: Payer: Self-pay | Admitting: Urgent Care

## 2018-04-06 VITALS — BP 110/64 | HR 76 | Temp 98.2°F | Ht 60.0 in | Wt 164.2 lb

## 2018-04-06 DIAGNOSIS — G8929 Other chronic pain: Secondary | ICD-10-CM

## 2018-04-06 DIAGNOSIS — M25562 Pain in left knee: Secondary | ICD-10-CM

## 2018-04-06 DIAGNOSIS — M2550 Pain in unspecified joint: Secondary | ICD-10-CM

## 2018-04-06 DIAGNOSIS — M25561 Pain in right knee: Secondary | ICD-10-CM

## 2018-04-06 MED ORDER — CELECOXIB 100 MG PO CAPS: 100.0000 mg | ORAL_CAPSULE | Freq: Two times a day (BID) | ORAL | 0 refills | Status: DC

## 2018-04-06 NOTE — Progress Notes (Signed)
    MRN: [id number] DOB: 06/21/1971  Subjective:   Kathleen Gutierrez is a 47 y.o. female presenting for 2 day history of bilateral knee pain, right worse than left. Patient walks daily for exercise. She was trying to run but had to switch to walking due to her pain. Ibuprofen has helped her in the past.  She has also had hand pain bilaterally in the past, used to work in maintenance and cleaning services.  Today, patient denies trauma, falls, swelling, warmth or redness, history of knee injuries or surgeries.  She admits that her mom has a history of severe arthritis in both knees.  Patient tries to stay very active with exercise, has done running and walking and elliptical, all because her knee pain.  Kathleen Gutierrez has a current medication list which includes the following prescription(s): ibuprofen, multivitamin with minerals, and omega-3 fatty acids. Also has No Known Allergies.  Kathleen Gutierrez  has a past medical history of Headache.  Denies past surgical history.  Objective:   Vitals: BP 110/64 (BP Location: Left Arm, Patient Position: Sitting, Cuff Size: Normal)   Pulse 76   Temp 98.2 F (36.8 C) (Oral)   Ht 5' (1.524 m)   Wt 164 lb 3.2 oz (74.5 kg)   SpO2 96%   BMI 32.07 kg/m   Physical Exam  Constitutional: She is oriented to person, place, and time. She appears well-developed and well-nourished.  Cardiovascular: Normal rate.  Pulmonary/Chest: Effort normal.  Musculoskeletal:       Right knee: She exhibits normal range of motion, no swelling, no effusion, no ecchymosis, no deformity, no laceration, no erythema, normal alignment, normal patellar mobility and no bony tenderness. No tenderness found.       Left knee: She exhibits normal range of motion, no swelling, no effusion, no ecchymosis, no deformity, no laceration, no erythema, normal alignment, normal patellar mobility and no bony tenderness. No tenderness found.  Neurological: She is alert and oriented to person, place, and time.    Assessment and Plan :   Chronic pain of both knees  Multiple joint pain  Counseled patient on conservative management with NSAID, icing after exercise.  I prescribed her Celebrex to use for her current pain and inflammation which I suspect is due to overuse and her family history of arthritis.  Patient is to switch her exercise activities to either the pool, bicycling, yoga.  She is in agreement with this plan.  If her pain persist we may try a steroid course.  Patient is also willing to try and see an orthopedist.  Will consider this in the future if we can situate her pain.  Wallis BambergMario Emelia Sandoval, PA-C [facility] at [facility] [phone] 04/06/2018  1:29 PM

## 2018-04-06 NOTE — Patient Instructions (Addendum)
Debe tomar 2 litros (1 galon) de agua todos los dias y mas cuando hace ejercisio. Cuando hace ejercisio debe colocar hielo despues por 20 minutos cada dos horas hasta que se duerma. Tome 500mg  de Tylenol con ibuprofen 600mg  cada 6 horas con comida para dolor y inflammacion.      Dolor de Engineer, technical salesrodilla en los adultos Knee Pain, Adult El dolor de rodilla es un problema frecuente en los adultos. Puede tener muchas causas, entre ellas:  Artritis.  Un saco lleno de lquido (quiste) o un crecimiento en la rodilla.  Una infeccin en la rodilla.  Una lesin que no se [place].  Dao, hinchazn o irritacin de los tejidos que sostienen la rodilla.  Por lo general, el dolor de rodilla no es un signo de un problema grave. El Estate manager/land agentdolor podra desaparecer solo con el tiempo y el descanso. De no ser as, un mdico podra indicarle que se haga estudios para hallar la causa del dolor. Estos pueden incluir lo siguiente:  Estudios de diagnstico por imgenes, como una radiografa, una resonancia magntica (RM) o una ecografa.  Aspiracin de una articulacin. En Regions Financial Corporationeste estudio, se extrae lquido de la rodilla.  Artroscopia. En este estudio, un tubo iluminado se introduce en la rodilla, y se proyecta una imagen en una pantalla.  Biopsia. En Regions Financial Corporationeste estudio, se toma [place]una muestra de tejido del organismo y se la estudia con un microscopio.  Siga estas instrucciones en su casa: Est atento a cualquier cambio en los sntomas. Tome estas medidas para Engineer, materialsaliviar el dolor. Actividad  Ponga la rodilla en reposo.  No haga cosas que le causen dolor o que lo intensifiquen.  Evite las actividades o los ejercicios de alto impacto, como correr, Public relations account executivesaltar la soga o hacer saltos de tijera. Instrucciones generales  Baxter Internationalome los medicamentos de venta libre y los recetados solamente como se lo haya indicado el mdico.  Cuando est sentado o acostado, levante (eleve) la rodilla por encima del nivel del corazn.  Duerma con una almohada debajo  de la rodilla.  Si se lo indicaron, colquese hielo sobre la rodilla: ? Ponga el hielo en una bolsa plstica. ? Coloque una FirstEnergy Corptoalla entre la piel y la bolsa de hielo. ? Coloque el hielo durante 20minutos, 2 o 3veces por da.  Pregntele al mdico si debe usar una Neurosurgeonrodillera elstica.  Baje de peso si es necesario. El exceso de peso puede generar presin en la rodilla.  No consuma ningn producto que contenga nicotina o tabaco, como cigarrillos y Administrator, Civil Servicecigarrillos electrnicos. Fumar puede retrasar la curacin de cualquier problema que tenga en el hueso y la articulacin. Si necesita ayuda para dejar de fumar, consulte al American Expressmdico. Comunquese con un mdico si:  El dolor de rodilla contina, [place] o empeora.  Tiene fiebre junto con dolor de rodilla.  La rodilla se le tuerce o se le traba.  La rodilla se hincha, y la hinchazn empeora. Solicite ayuda de inmediato si:  La rodilla est caliente al tacto.  No puede mover la rodilla.  Siente un dolor intenso en la rodilla.  Siente dolor en el pecho.  Tiene dificultad para respirar. Resumen  El dolor de rodilla es un problema frecuente en los adultos. Puede ser consecuencia de muchas cosas, entre ellas, artritis, infecciones, quistes o lesiones.  Por lo general, el dolor de rodilla no es un signo de un problema grave; sin embargo, si no desaparece, un mdico podra indicarle que se realice algunos estudios para hallar la causa del dolor.  Est atento  a cualquier cambio en los sntomas. [employer] dolor con descanso, medicamentos, actividad de poca intensidad y [place] de hielo.  Procure ayuda si el dolor contina o se intensifica demasiado, si la rodilla se tuerce o se traba, o si tiene dolor en el pecho o dificultad para respirar. Esta informacin no tiene Theme park manager el consejo del mdico. Asegrese de hacerle al mdico cualquier pregunta que tenga. Document Released: 05/17/2008 Document Revised: 04/07/2017 Document Reviewed:  07/15/2014 Elsevier Interactive Patient Education  2018 [employer].     IF you received an x-ray today, you will receive an invoice from [facility]. Please contact [facility] at [phone] with questions or concerns regarding your invoice.   IF you received labwork today, you will receive an invoice from [place]. Please contact [facility] at [phone] with questions or concerns regarding your invoice.   Our billing staff will not be able to assist you with questions regarding bills from these companies.  You will be contacted with the lab results as soon as they are available. The fastest way to get your results is to activate your My Chart account. Instructions are located on the last page of this paperwork. If you have not heard from [place] regarding the results in 2 weeks, please contact this office.

## 2018-04-20 ENCOUNTER — Ambulatory Visit: Payer: Self-pay | Admitting: Urgent Care

## 2018-04-27 ENCOUNTER — Encounter: Payer: Self-pay | Admitting: Urgent Care

## 2018-04-27 ENCOUNTER — Ambulatory Visit: Payer: Self-pay | Admitting: Urgent Care

## 2018-04-27 ENCOUNTER — Ambulatory Visit (INDEPENDENT_AMBULATORY_CARE_PROVIDER_SITE_OTHER): Payer: Self-pay

## 2018-04-27 VITALS — BP 100/70 | HR 69 | Temp 97.9°F | Resp 16 | Ht 60.0 in | Wt 165.0 lb

## 2018-04-27 DIAGNOSIS — M25561 Pain in right knee: Secondary | ICD-10-CM

## 2018-04-27 DIAGNOSIS — M25562 Pain in left knee: Secondary | ICD-10-CM

## 2018-04-27 DIAGNOSIS — M2550 Pain in unspecified joint: Secondary | ICD-10-CM

## 2018-04-27 DIAGNOSIS — M79641 Pain in right hand: Secondary | ICD-10-CM

## 2018-04-27 DIAGNOSIS — G8929 Other chronic pain: Secondary | ICD-10-CM

## 2018-04-27 MED ORDER — MELOXICAM 7.5 MG PO TABS: 7.5000 mg | ORAL_TABLET | Freq: Every day | ORAL | 2 refills | Status: DC

## 2018-04-27 NOTE — Progress Notes (Signed)
    MRN: [id number] DOB: 01/19/1971  Subjective:   Kathleen Gutierrez is a 47 y.o. female presenting for follow up on recheck on bilateral knee pain, right hand pain.  Patient finished course of Celebrex with significant improvement in her joint pains.  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  Today, she reports that she is trying to rest more and find a different line work so that she does not suffer as much.  She is trying to make sure she does not have arthritis and would like to have x-rays done of her right knee and right hand.  Kathleen Gutierrez has a current medication list which includes the following prescription(s): multivitamin with minerals and omega-3 fatty acids. Also has No Known Allergies.  Kathleen Gutierrez  has a past medical history of Headache. Also  has a past surgical history that includes No past surgeries.  Objective:   Vitals: BP 100/70   Pulse 69   Temp 97.9 F (36.6 C) (Oral)   Resp 16   Ht 5' (1.524 m)   Wt 165 lb (74.8 kg)   SpO2 97%   BMI 32.22 kg/m   Physical Exam  Constitutional: She is oriented to person, place, and time. She appears well-developed and well-nourished.  Cardiovascular: Normal rate.  Pulmonary/Chest: Effort normal.  Neurological: She is alert and oriented to person, place, and time.   Dg Knee Complete 4 Views Right  Result Date: 04/27/2018 CLINICAL DATA:  Right knee pain, no known injury, initial encounter EXAM: RIGHT KNEE - COMPLETE 4+ VIEW COMPARISON:  None. FINDINGS: No evidence of fracture, dislocation, or joint effusion. No evidence of arthropathy or other focal bone abnormality. Soft tissues are unremarkable. IMPRESSION: No acute abnormality noted. Electronically Signed   By: Alcide Clever M.D.   On: 04/27/2018 17:13   Dg Hand Complete Right  Result Date: 04/27/2018 CLINICAL DATA:  Right hand pain, no known injury, initial encounter EXAM: RIGHT HAND - COMPLETE 3+ VIEW COMPARISON:  None. FINDINGS: There is  no evidence of fracture or dislocation. There is no evidence of arthropathy or other focal bone abnormality. Soft tissues are unremarkable. IMPRESSION: No acute abnormality noted. Electronically Signed   By: Alcide Clever M.D.   On: 04/27/2018 17:12     Assessment and Plan :   Chronic pain of both knees - Plan: DG Knee Complete 4 Views Right  Multiple joint pain  Right hand pain - Plan: DG Hand Complete Right  Will report x-ray results by phone.  I will have patient switch to meloxicam and provided with refills.  I counseled that I believe it is a good move for her to switch jobs so that she does not have problems with joint pains.  Follow-up as needed.  Of note, lab review shows that she does not have chronic kidney disease or liver disease.  Wallis Bamberg, PA-C [facility] [phone] 04/27/2018 4:32 PM

## 2018-04-27 NOTE — Patient Instructions (Addendum)
Dolor de rodilla en los adultos Knee Pain, Adult El dolor de rodilla es un problema frecuente en los adultos. Puede tener muchas causas, entre ellas:  Artritis.  Un saco lleno de lquido (quiste) o un crecimiento en la rodilla.  Una infeccin en la rodilla.  Una lesin que no se cura.  Dao, hinchazn o irritacin de los tejidos que sostienen la rodilla.  Por lo general, el dolor de rodilla no es un signo de un problema grave. El dolor podra desaparecer solo con el tiempo y el descanso. De no ser as, un mdico podra indicarle que se haga estudios para hallar la causa del dolor. Estos pueden incluir lo siguiente:  Estudios de diagnstico por imgenes, como una radiografa, una resonancia magntica (RM) o una ecografa.  Aspiracin de una articulacin. En este estudio, se extrae lquido de la rodilla.  Artroscopia. En este estudio, un tubo iluminado se introduce en la rodilla, y se proyecta una imagen en una pantalla.  Biopsia. En este estudio, se toma una muestra de tejido del organismo y se la estudia con un microscopio.  Siga estas instrucciones en su casa: Est atento a cualquier cambio en los sntomas. Tome estas medidas para aliviar el dolor. Actividad  Ponga la rodilla en reposo.  No haga cosas que le causen dolor o que lo intensifiquen.  Evite las actividades o los ejercicios de alto impacto, como correr, saltar la soga o hacer saltos de tijera. Instrucciones generales  Tome los medicamentos de venta libre y los recetados solamente como se lo haya indicado el mdico.  Cuando est sentado o acostado, levante (eleve) la rodilla por encima del nivel del corazn.  Duerma con una almohada debajo de la rodilla.  Si se lo indicaron, colquese hielo sobre la rodilla: ? Ponga el hielo en una bolsa plstica. ? Coloque una toalla entre la piel y la bolsa de hielo. ? Coloque el hielo durante 20minutos, 2 o 3veces por da.  Pregntele al mdico si debe usar una rodillera  elstica.  Baje de peso si es necesario. El exceso de peso puede generar presin en la rodilla.  No consuma ningn producto que contenga nicotina o tabaco, como cigarrillos y cigarrillos electrnicos. Fumar puede retrasar la curacin de cualquier problema que tenga en el hueso y la articulacin. Si necesita ayuda para dejar de fumar, consulte al mdico. Comunquese con un mdico si:  El dolor de rodilla contina, cambia o empeora.  Tiene fiebre junto con dolor de rodilla.  La rodilla se le tuerce o se le traba.  La rodilla se hincha, y la hinchazn empeora. Solicite ayuda de inmediato si:  La rodilla est caliente al tacto.  No puede mover la rodilla.  Siente un dolor intenso en la rodilla.  Siente dolor en el pecho.  Tiene dificultad para respirar. Resumen  El dolor de rodilla es un problema frecuente en los adultos. Puede ser consecuencia de muchas cosas, entre ellas, artritis, infecciones, quistes o lesiones.  Por lo general, el dolor de rodilla no es un signo de un problema grave; sin embargo, si no desaparece, un mdico podra indicarle que se realice algunos estudios para hallar la causa del dolor.  Est atento a cualquier cambio en los sntomas. Alivie el dolor con descanso, medicamentos, actividad de poca intensidad y el uso de hielo.  Procure ayuda si el dolor contina o se intensifica demasiado, si la rodilla se tuerce o se traba, o si tiene dolor en el pecho o dificultad para respirar. Esta informacin no   tiene como fin reemplazar el consejo del mdico. Asegrese de hacerle al mdico cualquier pregunta que tenga. Document Released: 05/17/2008 Document Revised: 04/07/2017 Document Reviewed: 07/15/2014 Elsevier Interactive Patient Education  2018 Elsevier Inc.     IF you received an x-ray today, you will receive an invoice from [facility]. Please contact [facility] at [phone] with questions or concerns regarding your invoice.   IF you  received labwork today, you will receive an invoice from [facility]. Please contact [facility] at [phone] with questions or concerns regarding your invoice.   Our billing staff will not be able to assist you with questions regarding bills from these companies.  You will be contacted with the lab results as soon as they are available. The fastest way to get your results is to activate your My Chart account. Instructions are located on the last page of this paperwork. If you have not heard from us regarding the results in 2 weeks, please contact this office.      

## 2018-05-02 ENCOUNTER — Encounter: Payer: Self-pay | Admitting: *Deleted

## 2018-05-04 ENCOUNTER — Telehealth: Payer: Self-pay | Admitting: Urgent Care

## 2018-05-04 NOTE — Telephone Encounter (Signed)
Copied from CRM [id number]. Topic: Quick Communication - See Telephone Encounter >> May 04, 2018  1:48 PM Eston Mould B wrote: CRM for notification. See Telephone encounter for: 05/04/18.  Pt states she was prescribed meloxicam (MOBIC) 7.5 MG tablet  Last week and when  She started taking it  She felt light headed,  Her face felt hot and eyes irritated, she has stopped taking the medication and taking tylenol now.

## 2018-09-14 ENCOUNTER — Ambulatory Visit: Payer: Self-pay | Admitting: Emergency Medicine

## 2018-09-14 ENCOUNTER — Other Ambulatory Visit: Payer: Self-pay

## 2018-09-14 ENCOUNTER — Encounter: Payer: Self-pay | Admitting: Emergency Medicine

## 2018-09-14 VITALS — BP 111/70 | HR 63 | Temp 98.2°F | Resp 18 | Ht 60.28 in | Wt 166.4 lb

## 2018-09-14 DIAGNOSIS — F418 Other specified anxiety disorders: Secondary | ICD-10-CM | POA: Insufficient documentation

## 2018-09-14 DIAGNOSIS — G44209 Tension-type headache, unspecified, not intractable: Secondary | ICD-10-CM | POA: Insufficient documentation

## 2018-09-14 MED ORDER — TRAMADOL HCL 50 MG PO TABS: 50.0000 mg | ORAL_TABLET | Freq: Three times a day (TID) | ORAL | 0 refills | Status: DC | PRN

## 2018-09-14 MED ORDER — ALPRAZOLAM 0.5 MG PO TABS: 0.5000 mg | ORAL_TABLET | Freq: Every evening | ORAL | 0 refills | Status: DC | PRN

## 2018-09-14 NOTE — Progress Notes (Signed)
Kathleen Gutierrez 47 y.o.   Chief Complaint  Patient presents with  . Headache    X 2 weeks     HISTORY OF PRESENT ILLNESS: This is a 47 y.o. female complaining of headache for 2 weeks.  Almost daily tension headache occipital area pressure-like associated with anxiety state brought on by sick mother in [place].  No other significant symptoms.  HPI   Prior to Admission medications   Medication Sig Start Date End Date Taking? Authorizing Provider  Multiple Vitamin (MULTIVITAMIN WITH MINERALS) TABS tablet Take 1 tablet by mouth daily.   Yes [provider]  Omega-3 Fatty Acids (OMEGA-3 CF PO) Take by mouth.   Yes [provider]  meloxicam (MOBIC) 7.5 MG tablet Take 1 tablet (7.5 mg total) by mouth daily. Patient not taking: Reported on 09/14/2018 04/27/18   Wallis Bamberg, PA-C    No Known Allergies  There are no active problems to display for this patient.   Past Medical History:  Diagnosis Date  . Headache     Past Surgical History:  Procedure Laterality Date  . NO PAST SURGERIES      Social History   Socioeconomic History  . Marital status: Married    Spouse name: Not on file  . Number of children: 3  . Years of education: Not on file  . Highest education level: Not on file  Occupational History  . Not on file  Social Needs  . Financial resource strain: Not on file  . Food insecurity:    Worry: Not on file    Inability: Not on file  . Transportation needs:    Medical: Not on file    Non-medical: Not on file  Tobacco Use  . Smoking status: Never Smoker  . Smokeless tobacco: Never Used  Substance and Sexual Activity  . Alcohol use: No  . Drug use: No  . Sexual activity: Yes  Lifestyle  . Physical activity:    Days per week: Not on file    Minutes per session: Not on file  . Stress: Not on file  Relationships  . Social connections:    Talks on phone: Not on file    Gets together: Not on file    Attends religious service: Not  on file    Active member of club or organization: Not on file    Attends meetings of clubs or organizations: Not on file    Relationship status: Not on file  . Intimate partner violence:    Fear of current or ex partner: Not on file    Emotionally abused: Not on file    Physically abused: Not on file    Forced sexual activity: Not on file  Other Topics Concern  . Not on file  Social History Narrative   Does not worked - stays at home with 24 year old daughter.    Family History  Problem Relation Age of Onset  . Hypertension Mother   . Diabetes Father   . Hypertension Sister   . Diabetes Sister   . Hypertension Brother      Review of Systems  Constitutional: Negative.  Negative for chills and fever.  HENT: Negative.  Negative for hearing loss and sore throat.   Eyes: Negative.  Negative for blurred vision and double vision.  Respiratory: Negative.  Negative for cough and shortness of breath.   Cardiovascular: Negative.  Negative for chest pain and palpitations.  Gastrointestinal: Negative for abdominal pain, diarrhea, nausea and vomiting.  Skin: Negative.  Negative for rash.  Neurological: Positive for headaches. Negative for dizziness, sensory change, speech change, focal weakness, seizures, loss of consciousness and weakness.  Endo/Heme/Allergies: Negative.   All other systems reviewed and are negative.  Vitals:   09/14/18 1714  BP: 111/70  Pulse: 63  Resp: 18  Temp: 98.2 F (36.8 C)  SpO2: 97%     Physical Exam  Constitutional: She is oriented to person, place, and time. She appears well-developed and well-nourished.  HENT:  Head: Normocephalic.  Right Ear: External ear normal.  Left Ear: External ear normal.  Nose: Nose normal.  Mouth/Throat: Oropharynx is clear and moist.  Eyes: Pupils are equal, round, and reactive to light. Conjunctivae and EOM are normal.  Neck: Normal range of motion. Neck supple. No thyromegaly present.  Cardiovascular: Normal rate  and regular rhythm.  Pulmonary/Chest: Effort normal and breath sounds normal.  Musculoskeletal: Normal range of motion.  Lymphadenopathy:    She has no cervical adenopathy.  Neurological: She is alert and oriented to person, place, and time. She displays normal reflexes. No cranial nerve deficit or sensory deficit. She exhibits normal muscle tone. Coordination normal.  Skin: Skin is warm and dry. Capillary refill takes less than 2 seconds.  Psychiatric: She has a normal mood and affect. Her behavior is normal.  Vitals reviewed.    ASSESSMENT & PLAN: [name] was seen today for headache.  Diagnoses and all orders for this visit:  Acute non intractable tension-type headache -     traMADol (ULTRAM) 50 MG tablet; Take 1 tablet (50 mg total) by mouth every 8 (eight) hours as needed for moderate pain or severe pain.  Situational anxiety -     ALPRAZolam (XANAX) 0.5 MG tablet; Take 1 tablet (0.5 mg total) by mouth at bedtime as needed for anxiety.    Patient Instructions       If you have lab work done today you will be contacted with your lab results within the next 2 weeks.  If you have not heard from [place] then please contact us. The fastest way to get your results is to register for My Chart.   IF you received an x-ray today, you will receive an invoice from [facility]. Please contact [facility] at [phone] with questions or concerns regarding your invoice.   IF you received labwork today, you will receive an invoice from [place]. Please contact [facility] at [phone] with questions or concerns regarding your invoice.   Our billing staff will not be able to assist you with questions regarding bills from these companies.  You will be contacted with the lab results as soon as they are available. The fastest way to get your results is to activate your My Chart account. Instructions are located on the last page of this paperwork. If you have not heard from [place]  regarding the results in 2 weeks, please contact this office.     Cefalea tensional (Tension Headache) Una cefalea tensional es un dolor o presin que se siente en la frente y los lados de la cabeza. Estas cefaleas pueden durar de a varios das. CUIDADOS EN EL HOGAR Control del [employer] de venta libre y los recetados solamente como se lo haya indicado el mdico.  Cuando sienta dolor de cabeza acustese en un cuarto oscuro y tranquilo.  Si se lo indican, aplquese hielo en la zona de la cabeza y el cuello: ? Ponga el hielo en una bolsa plstica. ? Coloque  una toalla entre la piel y la bolsa de hielo. ? Coloque el hielo durante , 2 a 3veces por da.  Utilice una almohadilla trmica o tome una ducha caliente para aplicar calor en la zona de la cabeza y el cuello segn las indicaciones del mdico. Comida y bebida  Mantenga un horario para las comidas.  No beba mucho alcohol.  No consuma mucha cafena ni deje de consumirla por completo. Instrucciones generales  Concurra a todas las visitas de control como se lo haya indicado el mdico. Esto es importante.  Lleve un registro diario para Financial risk analyst si ciertas cosas provocan los dolores de [place]. Por ejemplo, escriba los siguientes datos: ? Lo que usted come y bebe. ? Cunto tiempo duerme. ? Algn cambio en su dieta o en los medicamentos.  Pruebe recibir Customer service manager o hacer otras cosas que lo ayuden a Lexicographer.  Disminuya el nivel de estrs.  Sintese con la espalda recta. No contraiga (tensione) los msculos.  No consuma productos que contengan tabaco. Estos incluyen cigarrillos, tabaco para mascar y Administrator, Civil Service. Si necesita ayuda para dejar de fumar, consulte al mdico.  Haga ejercicios con regularidad tal como se lo indic el mdico.  Duerma lo suficiente. Es Designer, jewellery, entre 7 y 9horas de sueo. SOLICITE AYUDA SI:  Los medicamentos no logran [employer].  Tiene  un dolor de cabeza que es diferente del dolor de cabeza habitual.  Tiene Dentist estomacal (nuseas) o vomita.  Tiene fiebre. SOLICITE AYUDA DE INMEDIATO SI:  La cefalea es muy intensa.  Sigue vomitando.  Presenta rigidez en el cuello.  Tiene dificultad para ver.  Tiene dificultad para hablar.  Siente dolor en el ojo o en el odo.  Sus msculos estn dbiles, o pierde el control muscular.  Pierde el equilibrio o tiene problemas para Advertising account planner.  Siente que se desvanece (pierde el conocimiento) o se desmaya.  Se siente confundido. Esta informacin no tiene Theme park manager el consejo del mdico. Asegrese de hacerle al mdico cualquier pregunta que tenga. Document Released: 02/21/2012 Document Revised: 08/20/2015 Document Reviewed: 03/24/2015 Elsevier Interactive Patient Education  2018 [employer].      Edwina Barth, MD [facility]

## 2018-09-14 NOTE — Patient Instructions (Addendum)
   If you have lab work done today you will be contacted with your lab results within the next 2 weeks.  If you have not heard from us then please contact us. The fastest way to get your results is to register for My Chart.   IF you received an x-ray today, you will receive an invoice from [facility]. Please contact [facility] at [phone] with questions or concerns regarding your invoice.   IF you received labwork today, you will receive an invoice from [facility]. Please contact [facility] at [phone] with questions or concerns regarding your invoice.   Our billing staff will not be able to assist you with questions regarding bills from these companies.  You will be contacted with the lab results as soon as they are available. The fastest way to get your results is to activate your My Chart account. Instructions are located on the last page of this paperwork. If you have not heard from us regarding the results in 2 weeks, please contact this office.      Cefalea tensional (Tension Headache) Una cefalea tensional es un dolor o presin que se siente en la frente y los lados de la cabeza. Estas cefaleas pueden durar de 30minutos a varios das. CUIDADOS EN EL HOGAR Control del dolor  Tome los medicamentos de venta libre y los recetados solamente como se lo haya indicado el mdico.  Cuando sienta dolor de cabeza acustese en un cuarto oscuro y tranquilo.  Si se lo indican, aplquese hielo en la zona de la cabeza y el cuello: ? Ponga el hielo en una bolsa plstica. ? Coloque una toalla entre la piel y la bolsa de hielo. ? Coloque el hielo durante 20minutos, 2 a 3veces por da.  Utilice una almohadilla trmica o tome una ducha caliente para aplicar calor en la zona de la cabeza y el cuello segn las indicaciones del mdico. Comida y bebida  Mantenga un horario para las comidas.  No beba mucho alcohol.  No consuma mucha cafena ni deje de consumirla por  completo. Instrucciones generales  Concurra a todas las visitas de control como se lo haya indicado el mdico. Esto es importante.  Lleve un registro diario para averiguar si ciertas cosas provocan los dolores de cabeza. Por ejemplo, escriba los siguientes datos: ? Lo que usted come y bebe. ? Cunto tiempo duerme. ? Algn cambio en su dieta o en los medicamentos.  Pruebe recibir un masaje o hacer otras cosas que lo ayuden a relajarse.  Disminuya el nivel de estrs.  Sintese con la espalda recta. No contraiga (tensione) los msculos.  No consuma productos que contengan tabaco. Estos incluyen cigarrillos, tabaco para mascar y cigarrillos electrnicos. Si necesita ayuda para dejar de fumar, consulte al mdico.  Haga ejercicios con regularidad tal como se lo indic el mdico.  Duerma lo suficiente. Es decir, entre 7 y 9horas de sueo. SOLICITE AYUDA SI:  Los medicamentos no logran aliviar los sntomas.  Tiene un dolor de cabeza que es diferente del dolor de cabeza habitual.  Tiene malestar estomacal (nuseas) o vomita.  Tiene fiebre. SOLICITE AYUDA DE INMEDIATO SI:  La cefalea es muy intensa.  Sigue vomitando.  Presenta rigidez en el cuello.  Tiene dificultad para ver.  Tiene dificultad para hablar.  Siente dolor en el ojo o en el odo.  Sus msculos estn dbiles, o pierde el control muscular.  Pierde el equilibrio o tiene problemas para caminar.  Siente que se desvanece (pierde el conocimiento)   o se desmaya.  Se siente confundido. Esta informacin no tiene como fin reemplazar el consejo del mdico. Asegrese de hacerle al mdico cualquier pregunta que tenga. Document Released: 02/21/2012 Document Revised: 08/20/2015 Document Reviewed: 03/24/2015 Elsevier Interactive Patient Education  2018 Elsevier Inc.  

## 2018-09-16 ENCOUNTER — Ambulatory Visit: Payer: Self-pay | Admitting: Family Medicine

## 2018-09-16 ENCOUNTER — Encounter: Payer: Self-pay | Admitting: Family Medicine

## 2018-09-16 VITALS — BP 105/69 | HR 64 | Temp 98.0°F | Resp 16 | Ht 62.0 in | Wt 164.2 lb

## 2018-09-16 DIAGNOSIS — G44209 Tension-type headache, unspecified, not intractable: Secondary | ICD-10-CM

## 2018-09-16 MED ORDER — BUTALBITAL-APAP-CAFFEINE 50-325-40 MG PO TABS: 1.0000 | ORAL_TABLET | Freq: Four times a day (QID) | ORAL | 0 refills | Status: DC | PRN

## 2018-09-16 NOTE — Progress Notes (Signed)
Patient ID: Kathleen Gutierrez, female    DOB: Mar 21, 1971, 47 y.o.   MRN: [id number]  PCP: Patient, No Pcp Per  Chief Complaint  Patient presents with  . Headache    Subjective:  HPI Kathleen Gutierrez is a 47 y.o. female presents for evaluation of ongoing headaches. Recently seen in office on 09/14/2018 by Dr. Alvy Bimler for same complaint. She was prescribed Xanax and tramadol. She reports today the tramadol made her very ill. She has only taken the Xanax a couple times to sleep and did not take last night. HA continue in the same pattern and intensity-generalized, aching, and at time pulsating. With HA she has sensitivity to light. Of notes, she discontinue drinking caffeine abruptly 4 days ago. She did not associated caffeine withdrawal as a source of reoccuring HA.  She denies dizziness, nausea, vomiting, changes, new weakness, numbness or tingling of extremities. Social History   Socioeconomic History  . Marital status: Married    Spouse name: Not on file  . Number of children: 3  . Years of education: Not on file  . Highest education level: Not on file  Occupational History  . Not on file  Social Needs  . Financial resource strain: Not on file  . Food insecurity:    Worry: Not on file    Inability: Not on file  . Transportation needs:    Medical: Not on file    Non-medical: Not on file  Tobacco Use  . Smoking status: Never Smoker  . Smokeless tobacco: Never Used  Substance and Sexual Activity  . Alcohol use: No  . Drug use: No  . Sexual activity: Yes  Lifestyle  . Physical activity:    Days per week: Not on file    Minutes per session: Not on file  . Stress: Not on file  Relationships  . Social connections:    Talks on phone: Not on file    Gets together: Not on file    Attends religious service: Not on file    Active member of club or organization: Not on file    Attends meetings of clubs or organizations: Not on file    Relationship status: Not on  file  . Intimate partner violence:    Fear of current or ex partner: Not on file    Emotionally abused: Not on file    Physically abused: Not on file    Forced sexual activity: Not on file  Other Topics Concern  . Not on file  Social History Narrative   Does not worked - stays at home with 6 year old daughter.    Family History  Problem Relation Age of Onset  . Hypertension Mother   . Diabetes Father   . Hypertension Sister   . Diabetes Sister   . Hypertension Brother    Review of Systems Pertinent negatives listed in HPI Patient Active Problem List   Diagnosis Date Noted  . Acute non intractable tension-type headache 09/14/2018  . Situational anxiety 09/14/2018    No Known Allergies  Prior to Admission medications   Medication Sig Start Date End Date Taking? Authorizing Provider  Multiple Vitamin (MULTIVITAMIN WITH MINERALS) TABS tablet Take 1 tablet by mouth daily.   Yes [provider]  Omega-3 Fatty Acids (OMEGA-3 CF PO) Take by mouth.   Yes [provider]  ALPRAZolam Prudy Feeler) 0.5 MG tablet Take 1 tablet (0.5 mg total) by mouth at bedtime as needed for anxiety. Patient not taking: Reported on 09/16/2018  09/14/18   Georgina Quint, MD  meloxicam (MOBIC) 7.5 MG tablet Take 1 tablet (7.5 mg total) by mouth daily. Patient not taking: Reported on 09/14/2018 04/27/18   Wallis Bamberg, PA-C  traMADol (ULTRAM) 50 MG tablet Take 1 tablet (50 mg total) by mouth every 8 (eight) hours as needed for moderate pain or severe pain. Patient not taking: Reported on 09/16/2018 09/14/18   Georgina Quint, MD    Past Medical, Surgical Family and Social History reviewed and updated.    Objective:   Today's Vitals   09/16/18 1233  BP: 105/69  Pulse: 64  Resp: 16  Temp: 98 F (36.7 C)  TempSrc: Oral  Weight: 164 lb 3.2 oz (74.5 kg)  Height: 5\' 2"  (1.575 m)    Wt Readings from Last 3 Encounters:  09/16/18 164 lb 3.2 oz (74.5 kg)  09/14/18 166 lb 6.4 oz  (75.5 kg)  04/27/18 165 lb (74.8 kg)     Physical Exam General appearance: alert, well developed, well nourished, cooperative and in no distress Head: Normocephalic, without obvious abnormality, atraumatic Respiratory: Respirations even and unlabored, normal respiratory rate Heart: rate and rhythm normal. No gallop or murmurs noted on exam  Extremities: No gross deformities Skin: Skin color, texture, turgor normal. No rashes seen  Psych: Appropriate mood and affect. Neurologic: Mental status: Alert, oriented to person, place, and time, thought content appropriate.  Lab Results  Component Value Date   POCGLU 89 07/26/2017    Lab Results  Component Value Date   HGBA1C 6.1 10/24/2017     Assessment & Plan:  1. Acute non intractable tension-type headache Discontinue tramadol.  Will trial Fioricet contains caffeine which may help alleviate ongoing headaches.  Asked patient if these headaches continue in spite of current treatment she may warrant a referral to the headache clinic.  Advised headaches occur as a result of abrupt withdrawal of caffeine.  Okay to slowly introduce small volumes of caffeine headaches occur.  Urged to use of Fioricet and Xanax at the same time. Patient verbalized understanding and agreement with this plan.  Meds ordered this encounter  Medications  . butalbital-acetaminophen-caffeine (FIORICET, ESGIC) 50-325-40 MG tablet    Sig: Take 1 tablet by mouth every 6 (six) hours as needed for headache or migraine (severe headache).    Dispense:  20 tablet    Refill:  0    The patient was given clear instructions to go to ER or return to medical center if symptoms do not improve, worsen or new problems develop. The patient verbalized understanding.     Godfrey Pick. Tiburcio Pea, FNP-C Nurse Practitioner (PRN Staff)  Primary Care at [facility] [address] [place], [place]  [phone]

## 2018-09-16 NOTE — Patient Instructions (Addendum)
° ° ° °  If you have lab work done today you will be contacted with your lab results within the next 2 weeks.  If you have not heard from us then please contact us. The fastest way to get your results is to register for My Chart. ° ° °IF you received an x-ray today, you will receive an invoice from [facility]. Please contact [facility] at [phone] with questions or concerns regarding your invoice.  ° °IF you received labwork today, you will receive an invoice from [facility]. Please contact [facility] at [phone] with questions or concerns regarding your invoice.  ° °Our billing staff will not be able to assist you with questions regarding bills from these companies. ° °You will be contacted with the lab results as soon as they are available. The fastest way to get your results is to activate your My Chart account. Instructions are located on the last page of this paperwork. If you have not heard from us regarding the results in 2 weeks, please contact this office. °  ° ° ° °

## 2018-09-27 ENCOUNTER — Ambulatory Visit: Payer: Self-pay | Admitting: Emergency Medicine

## 2018-09-27 ENCOUNTER — Telehealth: Payer: Self-pay | Admitting: Family Medicine

## 2018-09-27 NOTE — Telephone Encounter (Signed)
Requested medication (s) are due for refill today:   Yes  Requested medication (s) are on the active medication list:   Yes  Future visit scheduled:   Yes with Sagardia  on 10/10/18   Last ordered: 09/16/18  #20  0 refills   Requested Prescriptions  Pending Prescriptions Disp Refills   butalbital-acetaminophen-caffeine (FIORICET, ESGIC) 50-325-40 MG tablet [Pharmacy Med Name: BUTAL/ACETAMN/CF 50-325-40 TAB] 20 tablet 0    Sig: TAKE 1 TABLET BY MOUTH EVERY 6 HOURS AS NEEDED FOR HEADACHE OR MIGRAINE(SEVERE HEADACHE)     Not Delegated - Analgesics:  Non-Opioid Analgesic Combinations Failed - 09/27/2018  8:30 AM      Failed - This refill cannot be delegated      Passed - Valid encounter within last 12 months    Recent Outpatient Visits          1 week ago Acute non intractable tension-type headache   [facility] at [facility], Godfrey Pick, FNP   1 week ago Acute non intractable tension-type headache   Primary Care at [facility], Eilleen Kempf, MD   5 months ago Chronic pain of both knees   Primary Care at [facility], [place], [place]   5 months ago Chronic pain of both knees   Primary Care at [facility], [place], [place]   11 months ago Other fatigue   Primary Care at [facility], [place], [place]      Future Appointments            In 1 week Sagardia, Eilleen Kempf, MD Primary Care at [place], [facility]

## 2018-09-28 NOTE — Telephone Encounter (Signed)
Patient called back to say that she has no more medication and is badly in need. She have an appointment scheduled for 10/10/18

## 2018-10-02 MED ORDER — BUTALBITAL-APAP-CAFFEINE 50-325-40 MG PO TABS: 1.0000 | ORAL_TABLET | Freq: Four times a day (QID) | ORAL | 0 refills | Status: DC | PRN

## 2018-10-02 NOTE — Addendum Note (Signed)
Addended by: Evie Lacks on: 10/02/2018 11:40 AM   Modules accepted: Orders

## 2018-10-02 NOTE — Telephone Encounter (Signed)
Approved medication. Thanks.

## 2018-10-10 ENCOUNTER — Ambulatory Visit: Payer: Self-pay | Admitting: Emergency Medicine

## 2018-10-10 ENCOUNTER — Encounter: Payer: Self-pay | Admitting: Emergency Medicine

## 2018-10-10 VITALS — BP 106/69 | HR 64 | Temp 97.9°F | Resp 16 | Ht 60.0 in | Wt 165.0 lb

## 2018-10-10 DIAGNOSIS — F418 Other specified anxiety disorders: Secondary | ICD-10-CM

## 2018-10-10 DIAGNOSIS — G44219 Episodic tension-type headache, not intractable: Secondary | ICD-10-CM

## 2018-10-10 MED ORDER — ALPRAZOLAM 0.5 MG PO TABS: 0.5000 mg | ORAL_TABLET | Freq: Every evening | ORAL | 0 refills | Status: DC | PRN

## 2018-10-10 MED ORDER — BUTALBITAL-APAP-CAFFEINE 50-325-40 MG PO TABS: 1.0000 | ORAL_TABLET | Freq: Four times a day (QID) | ORAL | 0 refills | Status: DC | PRN

## 2018-10-10 NOTE — Progress Notes (Signed)
Kathleen Gutierrez 47 y.o.   Chief Complaint  Patient presents with  . Headache    HISTORY OF PRESENT ILLNESS: This is a 47 y.o. female here for follow-up of headache.  Seen by me on 09/14/2018 and started on tramadol and Xanax for situational anxiety.  Did not tolerate tramadol well and was seen again on 09/16/2018.  Started on Fioricet with good results.  Doing well.  Has no complaints today however she developed reflux with esophagitis symptoms 1 week ago and has been taking omeprazole with good results.  Asymptomatic today.  Requesting medication refills.  HPI   Prior to Admission medications   Medication Sig Start Date End Date Taking? Authorizing Provider  ALPRAZolam Kathleen Gutierrez) 0.5 MG tablet Take 1 tablet (0.5 mg total) by mouth at bedtime as needed for anxiety. 09/14/18  Yes Kathleen Gutierrez, Kathleen Kempf, MD  butalbital-acetaminophen-caffeine (FIORICET, ESGIC) [phone] MG tablet Take 1 tablet by mouth every 6 (six) hours as needed for headache or migraine (severe headache). 10/02/18 10/02/19 Yes Kathleen Gutierrez, Kathleen Kempf, MD  Multiple Vitamin (MULTIVITAMIN WITH MINERALS) TABS tablet Take 1 tablet by mouth daily.   Yes [provider]  omega-3 acid ethyl esters (LOVAZA) 1 g capsule Take 1 g by mouth 2 (two) times daily.   Yes [provider]  meloxicam (MOBIC) 7.5 MG tablet Take 1 tablet (7.5 mg total) by mouth daily. Patient not taking: Reported on 09/14/2018 04/27/18   Wallis Bamberg, PA-C  Omega-3 Fatty Acids (OMEGA-3 CF PO) Take by mouth.    [provider]    No Known Allergies  Patient Active Problem List   Diagnosis Date Noted  . Acute non intractable tension-type headache 09/14/2018  . Situational anxiety 09/14/2018    Past Medical History:  Diagnosis Date  . Headache     Past Surgical History:  Procedure Laterality Date  . NO PAST SURGERIES      Social History   Socioeconomic History  . Marital status: Married    Spouse name: Not on file  .  Number of children: 3  . Years of education: Not on file  . Highest education level: Not on file  Occupational History  . Not on file  Social Needs  . Financial resource strain: Not on file  . Food insecurity:    Worry: Not on file    Inability: Not on file  . Transportation needs:    Medical: Not on file    Non-medical: Not on file  Tobacco Use  . Smoking status: Never Smoker  . Smokeless tobacco: Never Used  Substance and Sexual Activity  . Alcohol use: No  . Drug use: No  . Sexual activity: Yes  Lifestyle  . Physical activity:    Days per week: Not on file    Minutes per session: Not on file  . Stress: Not on file  Relationships  . Social connections:    Talks on phone: Not on file    Gets together: Not on file    Attends religious service: Not on file    Active member of club or organization: Not on file    Attends meetings of clubs or organizations: Not on file    Relationship status: Not on file  . Intimate partner violence:    Fear of current or ex partner: Not on file    Emotionally abused: Not on file    Physically abused: Not on file    Forced sexual activity: Not on file  Other Topics Concern  .  Not on file  Social History Narrative   Does not worked - stays at home with 68 year old daughter.    Family History  Problem Relation Age of Onset  . Hypertension Mother   . Diabetes Father   . Hypertension Sister   . Diabetes Sister   . Hypertension Brother      Review of Systems  Constitutional: Negative.  Negative for chills and fever.  HENT: Negative.  Negative for sore throat.   Eyes: Negative.  Negative for blurred vision and double vision.  Respiratory: Negative.  Negative for cough and shortness of breath.   Cardiovascular: Negative.  Negative for chest pain and palpitations.  Gastrointestinal: Positive for heartburn. Negative for blood in stool, diarrhea and melena.  Genitourinary: Negative.   Musculoskeletal: Negative.  Negative for myalgias  and neck pain.  Skin: Negative.   Neurological: Positive for headaches.  Psychiatric/Behavioral: The patient is nervous/anxious.   All other systems reviewed and are negative.  Vitals:   10/10/18 1037  BP: 106/69  Pulse: 64  Resp: 16  Temp: 97.9 F (36.6 C)  SpO2: 97%     Physical Exam  Constitutional: She is oriented to person, place, and time. She appears well-developed and well-nourished.  HENT:  Head: Normocephalic and atraumatic.  Right Ear: External ear normal.  Left Ear: External ear normal.  Nose: Nose normal.  Mouth/Throat: Oropharynx is clear and moist.  Eyes: Pupils are equal, round, and reactive to light. Conjunctivae and EOM are normal.  Neck: Normal range of motion. Neck supple. No JVD present. No thyromegaly present.  Cardiovascular: Normal rate, regular rhythm and normal heart sounds.  Pulmonary/Chest: Effort normal and breath sounds normal.  Musculoskeletal: Normal range of motion.  Lymphadenopathy:    She has no cervical adenopathy.  Neurological: She is alert and oriented to person, place, and time. She displays normal reflexes. No cranial nerve deficit or sensory deficit. She exhibits normal muscle tone. Coordination normal.  Skin: Skin is warm and dry. Capillary refill takes less than 2 seconds.  Psychiatric: She has a normal mood and affect. Her behavior is normal.  Vitals reviewed.    ASSESSMENT & PLAN: Kathleen Gutierrez was seen today for headache.  Diagnoses and all orders for this visit:  Episodic tension-type headache, not intractable Comments: Controlled Orders: -     butalbital-acetaminophen-caffeine (FIORICET, ESGIC) 50-325-40 MG tablet; Take 1 tablet by mouth every 6 (six) hours as needed for headache or migraine (severe headache).  Situational anxiety Comments: Improved Orders: -     ALPRAZolam (XANAX) 0.5 MG tablet; Take 1 tablet (0.5 mg total) by mouth at bedtime as needed for anxiety.  Situational anxiety -     ALPRAZolam (XANAX) 0.5 MG  tablet; Take 1 tablet (0.5 mg total) by mouth at bedtime as needed for anxiety.    Patient Instructions  Dolor de cabeza general sin causa (General Headache Without Cause) El dolor de cabeza es un dolor o Dentist que se siente en la zona de la cabeza o del cuello. Hay muchas causas y tipos de dolores de [place]. En algunos casos, es posible que no se encuentre la causa. CUIDADOS EN EL HOGAR Control del [employer] de venta libre y los recetados solamente como se lo haya indicado el mdico.  Cuando sienta dolor de cabeza acustese en un cuarto oscuro y tranquilo.  Si se lo indican, aplique hielo sobre la cabeza y la zona del cuello: ? Ponga el hielo en una bolsa plstica. ? Coloque  una toalla entre la piel y la bolsa de hielo. ? Coloque el hielo durante , 2 a 3veces por da.  Utilice una almohadilla trmica o tome una ducha con agua caliente para aplicar calor en la cabeza y la zona del cuello como se lo haya indicado el mdico.  Mantenga las luces tenues si le [employer] luces brillantes o sus dolores de cabeza empeoran. Comida y bebida  Mantenga un horario para las comidas.  Beba menos alcohol.  Consuma menos o deje de tomar cafena. Instrucciones generales  Concurra a todas las visitas de control como se lo haya indicado el mdico. Esto es importante.  Lleve un registro diario para Financial risk analyst si ciertas cosas provocan los dolores de [place]. Por ejemplo, escriba los siguientes datos: ? Lo que usted come y bebe. ? Cunto tiempo duerme. ? Algn cambio en su dieta o en los medicamentos.  Realice actividades relajantes, como recibir [employer].  Disminuya el nivel de estrs.  Sintese con la espalda recta. No contraiga (tensione) los msculos.  No consuma productos que contengan tabaco. Estos incluyen cigarrillos, tabaco para mascar y Administrator, Civil Service. Si necesita ayuda para dejar de fumar, consulte al mdico.  Haga ejercicios con  regularidad tal como se lo indic el mdico.  Duerma lo suficiente. Esto a menudo significa entre 7 y 9horas de sueo. SOLICITE AYUDA SI:  Los medicamentos no logran [employer].  Tiene un dolor de cabeza que es diferente a los otros dolores de [place].  Tiene malestar estomacal (nuseas) o vomita.  Tiene fiebre. SOLICITE AYUDA DE INMEDIATO SI:  El dolor de [place].  Sigue vomitando.  Presenta rigidez en el cuello.  Tiene dificultad para ver.  Tiene dificultad para hablar.  Siente dolor en el ojo o en el odo.  Sus msculos estn dbiles, o pierde el control muscular.  Pierde el equilibrio o tiene problemas para Advertising account planner.  Siente que se desvanece (pierde el conocimiento) o se desmaya.  Se siente confundido. Esta informacin no tiene Theme park manager el consejo del mdico. Asegrese de hacerle al mdico cualquier pregunta que tenga. Document Released: 02/21/2012 Document Revised: 08/20/2015 Document Reviewed: 03/24/2015 Elsevier Interactive Patient Education  2018 [employer].      Edwina Barth, MD [facility]

## 2018-10-10 NOTE — Patient Instructions (Signed)
Dolor de cabeza general sin causa (General Headache Without Cause) El dolor de cabeza es un dolor o malestar que se siente en la zona de la cabeza o del cuello. Hay muchas causas y tipos de dolores de cabeza. En algunos casos, es posible que no se encuentre la causa. CUIDADOS EN EL HOGAR Control del dolor  Tome los medicamentos de venta libre y los recetados solamente como se lo haya indicado el mdico.  Cuando sienta dolor de cabeza acustese en un cuarto oscuro y tranquilo.  Si se lo indican, aplique hielo sobre la cabeza y la zona del cuello: ? Ponga el hielo en una bolsa plstica. ? Coloque una toalla entre la piel y la bolsa de hielo. ? Coloque el hielo durante 20minutos, 2 a 3veces por da.  Utilice una almohadilla trmica o tome una ducha con agua caliente para aplicar calor en la cabeza y la zona del cuello como se lo haya indicado el mdico.  Mantenga las luces tenues si le molesta las luces brillantes o sus dolores de cabeza empeoran. Comida y bebida  Mantenga un horario para las comidas.  Beba menos alcohol.  Consuma menos o deje de tomar cafena. Instrucciones generales  Concurra a todas las visitas de control como se lo haya indicado el mdico. Esto es importante.  Lleve un registro diario para averiguar si ciertas cosas provocan los dolores de cabeza. Por ejemplo, escriba los siguientes datos: ? Lo que usted come y bebe. ? Cunto tiempo duerme. ? Algn cambio en su dieta o en los medicamentos.  Realice actividades relajantes, como recibir masajes.  Disminuya el nivel de estrs.  Sintese con la espalda recta. No contraiga (tensione) los msculos.  No consuma productos que contengan tabaco. Estos incluyen cigarrillos, tabaco para mascar y cigarrillos electrnicos. Si necesita ayuda para dejar de fumar, consulte al mdico.  Haga ejercicios con regularidad tal como se lo indic el mdico.  Duerma lo suficiente. Esto a menudo significa entre 7 y 9horas de  sueo. SOLICITE AYUDA SI:  Los medicamentos no logran aliviar los sntomas.  Tiene un dolor de cabeza que es diferente a los otros dolores de cabeza.  Tiene malestar estomacal (nuseas) o vomita.  Tiene fiebre. SOLICITE AYUDA DE INMEDIATO SI:  El dolor de cabeza empeora.  Sigue vomitando.  Presenta rigidez en el cuello.  Tiene dificultad para ver.  Tiene dificultad para hablar.  Siente dolor en el ojo o en el odo.  Sus msculos estn dbiles, o pierde el control muscular.  Pierde el equilibrio o tiene problemas para caminar.  Siente que se desvanece (pierde el conocimiento) o se desmaya.  Se siente confundido. Esta informacin no tiene como fin reemplazar el consejo del mdico. Asegrese de hacerle al mdico cualquier pregunta que tenga. Document Released: 02/21/2012 Document Revised: 08/20/2015 Document Reviewed: 03/24/2015 Elsevier Interactive Patient Education  2018 Elsevier Inc.  

## 2018-10-12 ENCOUNTER — Ambulatory Visit: Payer: Self-pay

## 2018-10-12 ENCOUNTER — Other Ambulatory Visit: Payer: Self-pay

## 2018-10-12 ENCOUNTER — Ambulatory Visit (HOSPITAL_COMMUNITY)
Admission: EM | Admit: 2018-10-12 | Discharge: 2018-10-12 | Disposition: A | Discharge status: Home / Self Care | Payer: Self-pay | Attending: Family Medicine | Admitting: Family Medicine

## 2018-10-12 ENCOUNTER — Encounter (HOSPITAL_COMMUNITY): Payer: Self-pay | Admitting: *Deleted

## 2018-10-12 DIAGNOSIS — R202 Paresthesia of skin: Secondary | ICD-10-CM

## 2018-10-12 LAB — POCT I-STAT, CHEM 8
BUN: 13 mg/dL (ref 6–20)
CALCIUM ION: 1.18 mmol/L (ref 1.15–1.40)
CHLORIDE: 106 mmol/L (ref 98–111)
Creatinine, Ser: 0.8 mg/dL (ref 0.44–1.00)
Glucose, Bld: 113 mg/dL — ABNORMAL HIGH (ref 70–99)
HCT: 41 % (ref 36.0–46.0)
Hemoglobin: 13.9 g/dL (ref 12.0–15.0)
Potassium: 3.7 mmol/L (ref 3.5–5.1)
SODIUM: 141 mmol/L (ref 135–145)
TCO2: 25 mmol/L (ref 22–32)

## 2018-10-12 MED ORDER — BETAMETHASONE DIPROPIONATE 0.05 % EX OINT: TOPICAL_OINTMENT | Freq: Two times a day (BID) | CUTANEOUS | 0 refills | Status: DC

## 2018-10-12 MED ORDER — HYDROXYZINE HCL 25 MG PO TABS: 25.0000 mg | ORAL_TABLET | Freq: Every evening | ORAL | 0 refills | Status: DC | PRN

## 2018-10-12 MED ORDER — PREDNISONE 10 MG (48) PO TBPK: ORAL_TABLET | ORAL | 0 refills | Status: DC

## 2018-10-12 NOTE — Discharge Instructions (Signed)
Your blood sugar tonight was 113. You may recheck this with your doctor.

## 2018-10-12 NOTE — ED Provider Notes (Signed)
[facility]   [id number] 10/12/18 Arrival Time: 1607  ASSESSMENT & PLAN:  1. Paresthesia of both hands    Onset < 24 hours ago. Some improvement. Blood sugar slightly elevated at 113. Non-fasting. May recheck with PCP. Observe.  Follow-up Information    Schedule an appointment as soon as possible for a visit  with Georgina Quint, MD.   Specialty:  Internal Medicine Contact information: [address] [place] [address] [phone]          Reviewed expectations re: course of current medical issues. Questions answered. Outlined signs and symptoms indicating need for more acute intervention. Patient verbalized understanding. After Visit Summary given.   SUBJECTIVE: History from: patient. Elisabeth Kaye Luoma is a 47 y.o. female who presents with complaint of fairly persistent bilateral "hand numbness". Noticed last evening. Slept well. Still present this morning. Slightly better. No new exposures or medications reported. No chemical exposures. Afebrile. No extremity strength changes. "Otherwise I feel well." Frequent headaches; followed by her PCP. No changes in nature of headaches. No speech abnormalities. Ambulatory without difficulty. No repetitive motions of hands reported.  ROS: As per HPI.   OBJECTIVE:  Vitals:   10/12/18 1644  BP: 116/67  Pulse: 67  Resp: 14  Temp: 98.1 F (36.7 C)  TempSrc: Oral  SpO2: 99%    General appearance: alert; no distress Eyes: PERRLA; EOMI; conjunctiva normal HENT: normocephalic; atraumatic Neck: supple  Lungs: clear to auscultation bilaterally; unlabored Heart: regular rate and rhythm without murmer Extremities: no edema; symmetrical with no gross deformities Skin: warm and dry Neurologic: normal gait; normal symmetric reflexes and strength of all extremities; sensation appears intact to sharp and dull of bilateral hands Psychological: alert and cooperative; normal mood and affect  Labs: Results  for orders placed or performed during the hospital encounter of 10/12/18  I-STAT, chem 8  Result Value Ref Range   Sodium 141 135 - 145 mmol/L   Potassium 3.7 3.5 - 5.1 mmol/L   Chloride 106 98 - 111 mmol/L   BUN 13 6 - 20 mg/dL   Creatinine, Ser 2.13 0.44 - 1.00 mg/dL   Glucose, Bld 086 (H) 70 - 99 mg/dL   Calcium, Ion 5.78 4.69 - 1.40 mmol/L   TCO2 25 22 - 32 mmol/L   Hemoglobin 13.9 12.0 - 15.0 g/dL   HCT 62.9 52.8 - 41.3 %    No Known Allergies  Past Medical History:  Diagnosis Date  . Headache    Social History   Socioeconomic History  . Marital status: Married    Spouse name: Not on file  . Number of children: 3  . Years of education: Not on file  . Highest education level: Not on file  Occupational History  . Not on file  Social Needs  . Financial resource strain: Not on file  . Food insecurity:    Worry: Not on file    Inability: Not on file  . Transportation needs:    Medical: Not on file    Non-medical: Not on file  Tobacco Use  . Smoking status: Never Smoker  . Smokeless tobacco: Never Used  Substance and Sexual Activity  . Alcohol use: No  . Drug use: No  . Sexual activity: Yes  Lifestyle  . Physical activity:    Days per week: Not on file    Minutes per session: Not on file  . Stress: Not on file  Relationships  . Social connections:    Talks on phone:  Not on file    Gets together: Not on file    Attends religious service: Not on file    Active member of club or organization: Not on file    Attends meetings of clubs or organizations: Not on file    Relationship status: Not on file  . Intimate partner violence:    Fear of current or ex partner: Not on file    Emotionally abused: Not on file    Physically abused: Not on file    Forced sexual activity: Not on file  Other Topics Concern  . Not on file  Social History Narrative   Does not worked - stays at home with 51 year old daughter.   Family History  Problem Relation Age of Onset  .  Hypertension Mother   . Diabetes Father   . Hypertension Sister   . Diabetes Sister   . Hypertension Brother    Past Surgical History:  Procedure Laterality Date  . NO PAST SURGERIES        Mardella Layman, MD 10/12/18 1758

## 2018-10-12 NOTE — ED Triage Notes (Signed)
C/o bilateral hand numbness onset last pm. States she feels fine other than that.

## 2018-10-12 NOTE — Telephone Encounter (Signed)
Message sent to Dr. Alvy Bimler See Triage message Pt instructed to go to ED

## 2018-10-12 NOTE — Telephone Encounter (Signed)
Pt. Reports yesterday evening she started having numbness and pain to both hands. Moving them and using both hands without difficulty. "I started the new medicine Dr. Alvy Bimler gave me. I think it could be that." States she still has the headache. Denies any other symptoms.No availability in the office. Instructed pt. To go to ED for evaluation. Verbalizes understanding.  Reason for Disposition . Patient sounds very sick or weak to the triager  Answer Assessment - Initial Assessment Questions 1. SYMPTOM: "What is the main symptom you are concerned about?" (e.g., weakness, numbness)     Numbness 2. ONSET: "When did this start?" (minutes, hours, days; while sleeping)     Last night 3. LAST NORMAL: "When was the last time you were normal (no symptoms)?"     Yesterday 4. PATTERN "Does this come and go, or has it been constant since it started?"  "Is it present now?"     Constant 5. CARDIAC SYMPTOMS: "Have you had any of the following symptoms: chest pain, difficulty breathing, palpitations?"     A little short of breath 6. NEUROLOGIC SYMPTOMS: "Have you had any of the following symptoms: headache, dizziness, vision loss, double vision, changes in speech, unsteady on your feet?"     Headache is the same 7. OTHER SYMPTOMS: "Do you have any other symptoms?"     No 8. PREGNANCY: "Is there any chance you are pregnant?" "When was your last menstrual period?"     No  Protocols used: NEUROLOGIC DEFICIT-A-AH

## 2018-11-01 ENCOUNTER — Encounter: Payer: Self-pay | Admitting: Emergency Medicine

## 2018-11-01 ENCOUNTER — Other Ambulatory Visit: Payer: Self-pay

## 2018-11-01 ENCOUNTER — Ambulatory Visit: Payer: Self-pay | Admitting: Emergency Medicine

## 2018-11-01 ENCOUNTER — Encounter: Payer: Self-pay | Admitting: Neurology

## 2018-11-01 VITALS — BP 126/76 | HR 75 | Temp 98.2°F | Resp 18 | Ht 60.0 in | Wt 166.2 lb

## 2018-11-01 DIAGNOSIS — G8929 Other chronic pain: Secondary | ICD-10-CM

## 2018-11-01 DIAGNOSIS — R51 Headache: Secondary | ICD-10-CM

## 2018-11-01 NOTE — Progress Notes (Signed)
Kathleen Gutierrez 47 y.o.   Chief Complaint  Patient presents with  . Migraine    follow up having pressure on back of head when doesnt take medication     HISTORY OF PRESENT ILLNESS: This is a 47 y.o. female complaining of recurrent headache to right occipital area for the past 5 to 6 months.  Has occasional visual problems.  Seen by me last on 10/10/2018 for same. Was seen at the urgent care center on 10/12/2018 with paresthesias of both hands.  Evaluated and sent home.  No diagnostic imaging done.  Headache is sharp and localized, intermittent, responds to Fioricet, not associated with other symptoms.  No changes since last visit, just persistent symptoms. HPI   Prior to Admission medications   Medication Sig Start Date End Date Taking? Authorizing Provider  ALPRAZolam Prudy Feeler) 0.5 MG tablet Take 1 tablet (0.5 mg total) by mouth at bedtime as needed for anxiety. 10/10/18  Yes Ranell Finelli, Eilleen Kempf, MD  butalbital-acetaminophen-caffeine (FIORICET, ESGIC) [phone] MG tablet Take 1 tablet by mouth every 6 (six) hours as needed for headache or migraine (severe headache). 10/10/18 10/10/19 Yes Hannia Matchett, Eilleen Kempf, MD  meloxicam (MOBIC) 7.5 MG tablet Take 1 tablet (7.5 mg total) by mouth daily. 04/27/18  Yes Wallis Bamberg, PA-C  Multiple Vitamin (MULTIVITAMIN WITH MINERALS) TABS tablet Take 1 tablet by mouth daily.   Yes [provider]  omega-3 acid ethyl esters (LOVAZA) 1 g capsule Take 1 g by mouth 2 (two) times daily.   Yes [provider]  Omega-3 Fatty Acids (OMEGA-3 CF PO) Take by mouth.   Yes [provider]    No Known Allergies  Patient Active Problem List   Diagnosis Date Noted  . Acute non intractable tension-type headache 09/14/2018  . Situational anxiety 09/14/2018    Past Medical History:  Diagnosis Date  . Headache     Past Surgical History:  Procedure Laterality Date  . NO PAST SURGERIES      Social History   Socioeconomic  History  . Marital status: Married    Spouse name: Not on file  . Number of children: 3  . Years of education: Not on file  . Highest education level: Not on file  Occupational History  . Not on file  Social Needs  . Financial resource strain: Not on file  . Food insecurity:    Worry: Not on file    Inability: Not on file  . Transportation needs:    Medical: Not on file    Non-medical: Not on file  Tobacco Use  . Smoking status: Never Smoker  . Smokeless tobacco: Never Used  Substance and Sexual Activity  . Alcohol use: No  . Drug use: No  . Sexual activity: Yes  Lifestyle  . Physical activity:    Days per week: Not on file    Minutes per session: Not on file  . Stress: Not on file  Relationships  . Social connections:    Talks on phone: Not on file    Gets together: Not on file    Attends religious service: Not on file    Active member of club or organization: Not on file    Attends meetings of clubs or organizations: Not on file    Relationship status: Not on file  . Intimate partner violence:    Fear of current or ex partner: Not on file    Emotionally abused: Not on file    Physically abused: Not on file  Forced sexual activity: Not on file  Other Topics Concern  . Not on file  Social History Narrative   Does not worked - stays at home with 45 year old daughter.    Family History  Problem Relation Age of Onset  . Hypertension Mother   . Diabetes Father   . Hypertension Sister   . Diabetes Sister   . Hypertension Brother      Review of Systems  Constitutional: Negative.  Negative for chills and fever.  HENT: Negative.  Negative for congestion, hearing loss, nosebleeds and sore throat.   Eyes: Negative.  Negative for blurred vision and double vision.  Respiratory: Negative.  Negative for cough and shortness of breath.   Cardiovascular: Negative.  Negative for chest pain and palpitations.  Gastrointestinal: Negative.  Negative for abdominal pain,  diarrhea, nausea and vomiting.  Genitourinary: Negative.   Musculoskeletal: Negative.  Negative for back pain, myalgias and neck pain.  Skin: Negative.  Negative for rash.  Neurological: Positive for headaches. Negative for dizziness, sensory change, focal weakness, seizures and loss of consciousness.  Endo/Heme/Allergies: Negative.   All other systems reviewed and are negative.   Vitals:   11/01/18 1038  BP: 126/76  Pulse: 75  Resp: 18  Temp: 98.2 F (36.8 C)  SpO2: 95%    Physical Exam  Constitutional: She is oriented to person, place, and time. She appears well-developed and well-nourished.  HENT:  Head: Normocephalic and atraumatic.  Right Ear: External ear normal.  Left Ear: External ear normal.  Nose: Nose normal.  Mouth/Throat: Oropharynx is clear and moist.  Eyes: Pupils are equal, round, and reactive to light. Conjunctivae and EOM are normal.  Neck: Normal range of motion. Neck supple.  Cardiovascular: Normal rate, regular rhythm and normal heart sounds.  Pulmonary/Chest: Effort normal and breath sounds normal.  Musculoskeletal: Normal range of motion.  Neurological: She is alert and oriented to person, place, and time. She displays normal reflexes. No cranial nerve deficit or sensory deficit. She exhibits normal muscle tone. Coordination normal.  Skin: Skin is warm and dry. Capillary refill takes less than 2 seconds.  Psychiatric: She has a normal mood and affect. Her behavior is normal.  Vitals reviewed.    ASSESSMENT & PLAN: Kathleen Gutierrez was seen today for migraine.  Diagnoses and all orders for this visit:  Chronic intractable headache, unspecified headache type -     Ambulatory referral to Neurology -     MR Brain W Wo Contrast; Future    Patient Instructions       If you have lab work done today you will be contacted with your lab results within the next 2 weeks.  If you have not heard from us then please contact us. The fastest way to get your results is  to register for My Chart.   IF you received an x-ray today, you will receive an invoice from [facility]. Please contact [facility] at [phone] with questions or concerns regarding your invoice.   IF you received labwork today, you will receive an invoice from [facility]. Please contact [facility] at [phone] with questions or concerns regarding your invoice.   Our billing staff will not be able to assist you with questions regarding bills from these companies.  You will be contacted with the lab results as soon as they are available. The fastest way to get your results is to activate your My Chart account. Instructions are located on the last page of this paperwork. If you have not  heard from [place] regarding the results in 2 weeks, please contact this office.     Dolor de cabeza general sin causa (General Headache Without Cause) El dolor de cabeza es un dolor o malestar que se siente en la zona de la cabeza o del cuello. Hay muchas causas y tipos de dolores de [place]. En algunos casos, es posible que no se encuentre la causa. CUIDADOS EN EL HOGAR Control del [employer] de venta libre y los recetados solamente como se lo haya indicado el mdico.  Cuando sienta dolor de cabeza acustese en un cuarto oscuro y tranquilo.  Si se lo indican, aplique hielo sobre la cabeza y la zona del cuello: ? Ponga el hielo en una bolsa plstica. ? Coloque una [employer] piel y la bolsa de hielo. ? Coloque el hielo durante , 2 a 3veces por da.  Utilice una almohadilla trmica o tome una ducha con agua caliente para aplicar calor en la cabeza y la zona del cuello como se lo haya indicado el mdico.  Mantenga las luces tenues si le [employer] luces brillantes o sus dolores de cabeza empeoran. Comida y bebida  Mantenga un horario para las comidas.  Beba menos alcohol.  Consuma menos o deje de tomar cafena. Instrucciones generales  Concurra a  todas las visitas de control como se lo haya indicado el mdico. Esto es importante.  Lleve un registro diario para Financial risk analyst si ciertas cosas provocan los dolores de [place]. Por ejemplo, escriba los siguientes datos: ? Lo que usted come y bebe. ? Cunto tiempo duerme. ? Algn cambio en su dieta o en los medicamentos.  Realice actividades relajantes, como recibir Jewell.  Disminuya el nivel de estrs.  Sintese con la espalda recta. No contraiga (tensione) los msculos.  No consuma productos que contengan tabaco. Estos incluyen cigarrillos, tabaco para mascar y Administrator, Civil Service. Si necesita ayuda para dejar de fumar, consulte al mdico.  Haga ejercicios con regularidad tal como se lo indic el mdico.  Duerma lo suficiente. Esto a menudo significa entre 7 y 9horas de sueo. SOLICITE AYUDA SI:  Los medicamentos no logran [employer].  Tiene un dolor de cabeza que es diferente a los otros dolores de [place].  Tiene malestar estomacal (nuseas) o vomita.  Tiene fiebre. SOLICITE AYUDA DE INMEDIATO SI:  El dolor de [place].  Sigue vomitando.  Presenta rigidez en el cuello.  Tiene dificultad para ver.  Tiene dificultad para hablar.  Siente dolor en el ojo o en el odo.  Sus msculos estn dbiles, o pierde el control muscular.  Pierde el equilibrio o tiene problemas para Advertising account planner.  Siente que se desvanece (pierde el conocimiento) o se desmaya.  Se siente confundido. Esta informacin no tiene Theme park manager el consejo del mdico. Asegrese de hacerle al mdico cualquier pregunta que tenga. Document Released: 02/21/2012 Document Revised: 08/20/2015 Document Reviewed: 03/24/2015 Elsevier Interactive Patient Education  2018 [employer].      Edwina Barth, MD [facility]

## 2018-11-01 NOTE — Patient Instructions (Addendum)
If you have lab work done today you will be contacted with your lab results within the next 2 weeks.  If you have not heard from [place] then please contact us. The fastest way to get your results is to register for My Chart.   IF you received an x-ray today, you will receive an invoice from [facility]. Please contact [facility] at [phone] with questions or concerns regarding your invoice.   IF you received labwork today, you will receive an invoice from [facility]. Please contact [facility] at [phone] with questions or concerns regarding your invoice.   Our billing staff will not be able to assist you with questions regarding bills from these companies.  You will be contacted with the lab results as soon as they are available. The fastest way to get your results is to activate your My Chart account. Instructions are located on the last page of this paperwork. If you have not heard from [place] regarding the results in 2 weeks, please contact this office.     Dolor de cabeza general sin causa (General Headache Without Cause) El dolor de cabeza es un dolor o malestar que se siente en la zona de la cabeza o del cuello. Hay muchas causas y tipos de dolores de [place]. En algunos casos, es posible que no se encuentre la causa. CUIDADOS EN EL HOGAR Control del [employer] de venta libre y los recetados solamente como se lo haya indicado el mdico.  Cuando sienta dolor de cabeza acustese en un cuarto oscuro y tranquilo.  Si se lo indican, aplique hielo sobre la cabeza y la zona del cuello: ? Ponga el hielo en una bolsa plstica. ? Coloque una [employer] piel y la bolsa de hielo. ? Coloque el hielo durante , 2 a 3veces por da.  Utilice una almohadilla trmica o tome una ducha con agua caliente para aplicar calor en la cabeza y la zona del cuello como se lo haya indicado el mdico.  Mantenga las luces tenues si le [employer] luces  brillantes o sus dolores de cabeza empeoran. Comida y bebida  Mantenga un horario para las comidas.  Beba menos alcohol.  Consuma menos o deje de tomar cafena. Instrucciones generales  Concurra a todas las visitas de control como se lo haya indicado el mdico. Esto es importante.  Lleve un registro diario para Financial risk analyst si ciertas cosas provocan los dolores de [place]. Por ejemplo, escriba los siguientes datos: ? Lo que usted come y bebe. ? Cunto tiempo duerme. ? Algn cambio en su dieta o en los medicamentos.  Realice actividades relajantes, como recibir [employer].  Disminuya el nivel de estrs.  Sintese con la espalda recta. No contraiga (tensione) los msculos.  No consuma productos que contengan tabaco. Estos incluyen cigarrillos, tabaco para mascar y Administrator, Civil Service. Si necesita ayuda para dejar de fumar, consulte al mdico.  Haga ejercicios con regularidad tal como se lo indic el mdico.  Duerma lo suficiente. Esto a menudo significa entre 7 y 9horas de sueo. SOLICITE AYUDA SI:  Los medicamentos no logran [employer].  Tiene un dolor de cabeza que es diferente a los otros dolores de [place].  Tiene malestar estomacal (nuseas) o vomita.  Tiene fiebre. SOLICITE AYUDA DE INMEDIATO SI:  El dolor de [place].  Sigue vomitando.  Presenta rigidez en el cuello.  Tiene dificultad para ver.  Tiene dificultad para hablar.  Siente dolor en el ojo o en el  odo.  Sus msculos estn dbiles, o pierde el control muscular.  Pierde el equilibrio o tiene problemas para Advertising account plannercaminar.  Siente que se desvanece (pierde el conocimiento) o se desmaya.  Se siente confundido. Esta informacin no tiene Theme park managercomo fin reemplazar el consejo del mdico. Asegrese de hacerle al mdico cualquier pregunta que tenga. Document Released: 02/21/2012 Document Revised: 08/20/2015 Document Reviewed: 03/24/2015 Elsevier Interactive Patient Education  [employer]2018 Elsevier Inc.

## 2018-11-08 ENCOUNTER — Ambulatory Visit: Payer: Self-pay | Admitting: Neurology

## 2018-11-08 NOTE — Progress Notes (Deleted)
NEUROLOGY CONSULTATION NOTE  Kathleen Gutierrez MRN: [id number] DOB: 1971/02/13  Referring provider: Georgina Quint, MD Primary care provider: Georgina Quint, MD  Reason for consult:  ***  HISTORY OF PRESENT ILLNESS: Kathleen Gutierrez is a 47 year old ***-handed female who presents for headaches.  History supplemented by referring provider's note and ED note.  Spanish-speaking interpreter present.  She started having a headache in September.  It is a *** sharp pain in the right occipital region.  There is no preceding aura.  It is *** associated with ***.  It typically lasts *** and occurs ***.  It is *** triggered by ***.  It is relieved by ***.    On 10/12/18, she presented to Urgent Care after noticing bilateral hand numbness the previous evening, ***.  No testing was performed and she was advised to follow up with her PCP  Current NSAIDS:  Mobic Current analgesics:  Fioricet Current triptans:  *** Current ergotamine:  *** Current anti-emetic:  *** Current muscle relaxants:  *** Current anti-anxiolytic:  Xanax Current sleep aide:  *** Current Antihypertensive medications:  *** Current Antidepressant medications:  *** Current Anticonvulsant medications:  *** Current anti-CGRP:  *** Current Vitamins/Herbal/Supplements:  *** Current Antihistamines/Decongestants:  *** Other therapy:  *** Other medication:  ***  Past NSAIDS:  *** Past analgesics:  *** Past abortive triptans:  *** Past abortive ergotamine:  *** Past muscle relaxants:  *** Past anti-emetic:  *** Past antihypertensive medications:  *** Past antidepressant medications:  *** Past anticonvulsant medications:  *** Past anti-CGRP:  *** Past vitamins/Herbal/Supplements:  *** Past antihistamines/decongestants:  *** Other past therapies:  ***  Caffeine:  *** Alcohol:  *** Smoker:  *** Diet:  *** Exercise:  *** Depression:  ***; Anxiety:  Yes.  This past year, her sick mother came to be  with her from [place]. Other pain:  Over the past year, she endorsed bilateral knee pain and right hand pain.  X-rays of right hand and knee from 04/27/18 were unremarkable. Sleep hygiene:  *** Family history of headache:  ***   PAST MEDICAL HISTORY: Past Medical History:  Diagnosis Date  . Headache     PAST SURGICAL HISTORY: Past Surgical History:  Procedure Laterality Date  . NO PAST SURGERIES      MEDICATIONS: Current Outpatient Medications on File Prior to Visit  Medication Sig Dispense Refill  . ALPRAZolam (XANAX) 0.5 MG tablet Take 1 tablet (0.5 mg total) by mouth at bedtime as needed for anxiety. 20 tablet 0  . butalbital-acetaminophen-caffeine (FIORICET, ESGIC) 50-325-40 MG tablet Take 1 tablet by mouth every 6 (six) hours as needed for headache or migraine (severe headache). 20 tablet 0  . meloxicam (MOBIC) 7.5 MG tablet Take 1 tablet (7.5 mg total) by mouth daily. 30 tablet 2  . Multiple Vitamin (MULTIVITAMIN WITH MINERALS) TABS tablet Take 1 tablet by mouth daily.    Marland Kitchen omega-3 acid ethyl esters (LOVAZA) 1 g capsule Take 1 g by mouth 2 (two) times daily.    . Omega-3 Fatty Acids (OMEGA-3 CF PO) Take by mouth.     No current facility-administered medications on file prior to visit.     ALLERGIES: No Known Allergies  FAMILY HISTORY: Family History  Problem Relation Age of Onset  . Hypertension Mother   . Diabetes Father   . Hypertension Sister   . Diabetes Sister   . Hypertension Brother    ***.  SOCIAL HISTORY: Social History   Socioeconomic History  . Marital  status: Married    Spouse name: Not on file  . Number of children: 3  . Years of education: Not on file  . Highest education level: Not on file  Occupational History  . Not on file  Social Needs  . Financial resource strain: Not on file  . Food insecurity:    Worry: Not on file    Inability: Not on file  . Transportation needs:    Medical: Not on file    Non-medical: Not on file  Tobacco  Use  . Smoking status: Never Smoker  . Smokeless tobacco: Never Used  Substance and Sexual Activity  . Alcohol use: No  . Drug use: No  . Sexual activity: Yes  Lifestyle  . Physical activity:    Days per week: Not on file    Minutes per session: Not on file  . Stress: Not on file  Relationships  . Social connections:    Talks on phone: Not on file    Gets together: Not on file    Attends religious service: Not on file    Active member of club or organization: Not on file    Attends meetings of clubs or organizations: Not on file    Relationship status: Not on file  . Intimate partner violence:    Fear of current or ex partner: Not on file    Emotionally abused: Not on file    Physically abused: Not on file    Forced sexual activity: Not on file  Other Topics Concern  . Not on file  Social History Narrative   Does not worked - stays at home with 42 year old daughter.    REVIEW OF SYSTEMS: Constitutional: No fevers, chills, or sweats, no generalized fatigue, change in appetite Eyes: No visual changes, double vision, eye pain Ear, nose and throat: No hearing loss, ear pain, nasal congestion, sore throat Cardiovascular: No chest pain, palpitations Respiratory:  No shortness of breath at rest or with exertion, wheezes GastrointestinaI: No nausea, vomiting, diarrhea, abdominal pain, fecal incontinence Genitourinary:  No dysuria, urinary retention or frequency Musculoskeletal:  No neck pain, back pain Integumentary: No rash, pruritus, skin lesions Neurological: as above Psychiatric: No depression, insomnia, anxiety Endocrine: No palpitations, fatigue, diaphoresis, mood swings, change in appetite, change in weight, increased thirst Hematologic/Lymphatic:  No purpura, petechiae. Allergic/Immunologic: no itchy/runny eyes, nasal congestion, recent allergic reactions, rashes  PHYSICAL EXAM: *** General: No acute distress.  Patient appears ***-groomed.  *** Head:   Normocephalic/atraumatic Eyes:  fundi examined but not visualized Neck: supple, no paraspinal tenderness, full range of motion Back: No paraspinal tenderness Heart: regular rate and rhythm Lungs: Clear to auscultation bilaterally. Vascular: No carotid bruits. Neurological Exam: Mental status: alert and oriented to person, place, and time, recent and remote memory intact, fund of knowledge intact, attention and concentration intact, speech fluent and not dysarthric, language intact. Cranial nerves: CN I: not tested CN II: pupils equal, round and reactive to light, visual fields intact CN III, IV, VI:  full range of motion, no nystagmus, no ptosis CN V: facial sensation intact CN VII: upper and lower face symmetric CN VIII: hearing intact CN IX, X: gag intact, uvula midline CN XI: sternocleidomastoid and trapezius muscles intact CN XII: tongue midline Bulk & Tone: normal, no fasciculations. Motor:  5/5 throughout *** Sensation:  Pinprick *** temperature *** and vibration sensation intact.  ***. Deep Tendon Reflexes:  2+ throughout, *** toes downgoing.  *** Finger to nose testing:  Without dysmetria.  ***  Heel to shin:  Without dysmetria.  *** Gait:  Normal station and stride.  Able to turn and tandem walk. Romberg ***.  IMPRESSION: ***  PLAN: ***  Thank you for allowing me to take part in the care of this patient.  Shon MilletAdam Jaffe, DO  CC: Georgina QuintMiguel Jose Sagardia, MD

## 2018-11-12 NOTE — Progress Notes (Signed)
NEUROLOGY CONSULTATION NOTE  Alinda Deemna Maria Gutierrez MRN: [id number] DOB: 14-Jan-1971  Referring provider: Georgina QuintMiguel Jose Sagardia, MD Primary care provider: Georgina QuintMiguel Jose Sagardia, MD  Reason for consult:  headaches  HISTORY OF PRESENT ILLNESS: Kathleen Gutierrez is a 47 year old right-handed female who presents for headaches.  History supplemented by referring provider's note and ED note.    She started having a headache in September.  It is a severe sharp pain in the right occipital region and sometimes above right eye and into jaw.  There is no preceding aura.  It is associated with nausea, heart burn, photophobia, phonophobia, blurred vision,burning and pain in hands.  There is no associated unilateral numbness or weakness..  It typically lasts all day (worse later in the day/night) and occurs 5 to 6 days a week.  It is triggered by using the phone.  It is relieved by rest.    Current NSAIDS:  none Current analgesics:  Fioricet (effective, takes twice a day) Current triptans: None Current ergotamine: None Current anti-emetic: None Current muscle relaxants: None Current anti-anxiolytic:  Xanax Current sleep aide: None Current Antihypertensive medications: None Current Antidepressant medications: None Current Anticonvulsant medications: None Current anti-CGRP: None Current Vitamins/Herbal/Supplements: None Current Antihistamines/Decongestants: None Other therapy: None Hormone/birth control: None  Past NSAIDS:  Meloxicam, ibuprofen Past analgesics:  tramadol, Excedrin Past abortive triptans: None Past abortive ergotamine: None Past muscle relaxants: None Past anti-emetic: None Past antihypertensive medications: None Past antidepressant medications: None Past anticonvulsant medications: None Past anti-CGRP: None Past vitamins/Herbal/Supplements: None Past antihistamines/decongestants:  none Other past therapies:  none  Caffeine: 1 to 2 cups of coffee daily Diet:   Drinks a lot of water daily Exercise:  Not routine Depression:  no; Anxiety:  Yes.  This past year, her sick mother came to be with her from [place]. Other pain:  Over the past year, she endorsed bilateral knee pain and right hand pain.  X-rays of right hand and knee from 04/27/18 were unremarkable. Sleep hygiene:  Sleeps well but has vivid dreams. She reports previous history of headaches but these are different.   PAST MEDICAL HISTORY: Past Medical History:  Diagnosis Date  . Headache     PAST SURGICAL HISTORY: Past Surgical History:  Procedure Laterality Date  . NO PAST SURGERIES      MEDICATIONS: Current Outpatient Medications on File Prior to Visit  Medication Sig Dispense Refill  . ALPRAZolam (XANAX) 0.5 MG tablet Take 1 tablet (0.5 mg total) by mouth at bedtime as needed for anxiety. 20 tablet 0  . butalbital-acetaminophen-caffeine (FIORICET, ESGIC) 50-325-40 MG tablet Take 1 tablet by mouth every 6 (six) hours as needed for headache or migraine (severe headache). 20 tablet 0  . meloxicam (MOBIC) 7.5 MG tablet Take 1 tablet (7.5 mg total) by mouth daily. 30 tablet 2  . Multiple Vitamin (MULTIVITAMIN WITH MINERALS) TABS tablet Take 1 tablet by mouth daily.    Marland Kitchen. omega-3 acid ethyl esters (LOVAZA) 1 g capsule Take 1 g by mouth 2 (two) times daily.    . Omega-3 Fatty Acids (OMEGA-3 CF PO) Take by mouth.     No current facility-administered medications on file prior to visit.     ALLERGIES: No Known Allergies  FAMILY HISTORY: Family History  Problem Relation Age of Onset  . Hypertension Mother   . Diabetes Father   . Hypertension Sister   . Diabetes Sister   . Hypertension Brother     SOCIAL HISTORY: Social History   Socioeconomic History  .  Marital status: Married    Spouse name: Not on file  . Number of children: 3  . Years of education: Not on file  . Highest education level: Not on file  Occupational History  . Not on file  Social Needs  . Financial  resource strain: Not on file  . Food insecurity:    Worry: Not on file    Inability: Not on file  . Transportation needs:    Medical: Not on file    Non-medical: Not on file  Tobacco Use  . Smoking status: Never Smoker  . Smokeless tobacco: Never Used  Substance and Sexual Activity  . Alcohol use: No  . Drug use: No  . Sexual activity: Yes  Lifestyle  . Physical activity:    Days per week: Not on file    Minutes per session: Not on file  . Stress: Not on file  Relationships  . Social connections:    Talks on phone: Not on file    Gets together: Not on file    Attends religious service: Not on file    Active member of club or organization: Not on file    Attends meetings of clubs or organizations: Not on file    Relationship status: Not on file  . Intimate partner violence:    Fear of current or ex partner: Not on file    Emotionally abused: Not on file    Physically abused: Not on file    Forced sexual activity: Not on file  Other Topics Concern  . Not on file  Social History Narrative   Does not worked - stays at home with 45 year old daughter.    REVIEW OF SYSTEMS: Constitutional: No fevers, chills, or sweats, no generalized fatigue, change in appetite Eyes: No visual changes, double vision, eye pain Ear, nose and throat: No hearing loss, ear pain, nasal congestion, sore throat Cardiovascular: No chest pain, palpitations Respiratory:  No shortness of breath at rest or with exertion, wheezes GastrointestinaI: No nausea, vomiting, diarrhea, abdominal pain, fecal incontinence Genitourinary:  No dysuria, urinary retention or frequency Musculoskeletal:  No neck pain, back pain Integumentary: No rash, pruritus, skin lesions Neurological: as above Psychiatric: No depression, insomnia, anxiety Endocrine: No palpitations, fatigue, diaphoresis, mood swings, change in appetite, change in weight, increased thirst Hematologic/Lymphatic:  No purpura,  petechiae. Allergic/Immunologic: no itchy/runny eyes, nasal congestion, recent allergic reactions, rashes  PHYSICAL EXAM: Blood pressure 98/72, pulse (!) 102, height 5' (1.524 m), weight 167 lb (75.8 kg), last menstrual period 10/28/2018, SpO2 98 %. General: No acute distress.  Patient appears well-groomed.  Head:  Normocephalic/atraumatic Eyes:  fundi examined but not visualized Neck: supple, no paraspinal tenderness, full range of motion Back: No paraspinal tenderness Heart: regular rate and rhythm Lungs: Clear to auscultation bilaterally. Vascular: No carotid bruits. Neurological Exam: Mental status: alert and oriented to person, place, and time, recent and remote memory intact, fund of knowledge intact, attention and concentration intact, speech fluent and not dysarthric, language intact. Cranial nerves: CN I: not tested CN II: pupils equal, round and reactive to light, visual fields intact CN III, IV, VI:  full range of motion, no nystagmus, no ptosis CN V: facial sensation intact CN VII: upper and lower face symmetric CN VIII: hearing intact CN IX, X: gag intact, uvula midline CN XI: sternocleidomastoid and trapezius muscles intact CN XII: tongue midline Bulk & Tone: normal, no fasciculations. Motor:  5/5 throughout  Sensation: temperature and vibration sensation intact. Deep Tendon Reflexes:  2+ throughout, toes downgoing.  Finger to nose testing:  Without dysmetria.  Heel to shin:  Without dysmetria.  Gait:  Normal station and stride.  Able to turn and tandem walk. Romberg negative.  IMPRESSION: New onset headaches.  Probably migraines.  Possibly triggered by stress. Anxiety  PLAN: 1.  As these are new headaches, mostly concentrated as a sharp pain in the right occipital region, will check CT/CTA of head to evaluate for secondary etiology. 2.  We will start amitriptyline 10 mg at bedtime.  If headaches not improved in 4 weeks, she will contact me and we can increase the  dose to 25 mg at bedtime. 3.  She will stop Fioricet.  For abortive therapy, she will try naproxen 500 mg.  If naproxen is ineffective, and CTA does not reveal any significant abnormalities, we can try sumatriptan. 4.  Advised to limit use of pain relievers to no more than 2 days of the week to prevent rebound headache. 5.  Keep headache diary. 6.  Advised to stop caffeine intake and to increase exercise. 7.  Advise treatment for stress of anxiety. 8.  Follow-up in 3 to 4 months.  Thank you for allowing me to take part in the care of this patient.  40 minutes spent face to face with patient, over 50% spent discussing management.  Shon Millet, DO  CC: Georgina Quint, MD

## 2018-11-13 ENCOUNTER — Encounter: Payer: Self-pay | Admitting: Neurology

## 2018-11-13 ENCOUNTER — Ambulatory Visit (INDEPENDENT_AMBULATORY_CARE_PROVIDER_SITE_OTHER): Payer: Self-pay | Admitting: Neurology

## 2018-11-13 VITALS — BP 98/72 | HR 102 | Ht 60.0 in | Wt 167.0 lb

## 2018-11-13 DIAGNOSIS — F419 Anxiety disorder, unspecified: Secondary | ICD-10-CM

## 2018-11-13 DIAGNOSIS — R51 Headache: Secondary | ICD-10-CM

## 2018-11-13 DIAGNOSIS — R519 Headache, unspecified: Secondary | ICD-10-CM

## 2018-11-13 MED ORDER — AMITRIPTYLINE HCL 10 MG PO TABS: 10.0000 mg | ORAL_TABLET | Freq: Every day | ORAL | 3 refills | Status: DC

## 2018-11-13 MED ORDER — NAPROXEN 500 MG PO TABS: 500.0000 mg | ORAL_TABLET | Freq: Two times a day (BID) | ORAL | 3 refills | Status: DC | PRN

## 2018-11-13 NOTE — Patient Instructions (Addendum)
1.  We will start amitriptyline 10mg  at bedtime.  If headaches not improved in 4 weeks, contact me and we can increase dose if needed. 2.  Stop butalbital-acetaminophen-caffeine.  Instead take naproxen 500mg  at earliest onset of headache.  May repeat dose in 12 hours if needed. 3.  Limit use of pain relievers to no more than 2 days out of week to prevent risk of rebound or medication-overuse headache. 4.  We will check CT/CTA of head  5.  Keep headache diary 6.  STOP DRINKING COFFEE (CAFFEINE) 7.  Follow up in 3 to 4 months  [facility] and they will call you directly to schedule your appt. They are located at [address] If you need to contact them directly please call [phone].

## 2018-11-27 ENCOUNTER — Ambulatory Visit: Payer: Self-pay | Admitting: Neurology

## 2018-12-04 ENCOUNTER — Other Ambulatory Visit: Payer: Self-pay

## 2018-12-04 ENCOUNTER — Ambulatory Visit
Admission: RE | Admit: 2018-12-04 | Discharge: 2018-12-04 | Disposition: A | Discharge status: Home / Self Care | Payer: No Typology Code available for payment source | Source: Ambulatory Visit | Attending: Neurology | Admitting: Neurology

## 2018-12-04 DIAGNOSIS — R519 Headache, unspecified: Secondary | ICD-10-CM

## 2018-12-04 DIAGNOSIS — R51 Headache: Principal | ICD-10-CM

## 2018-12-04 MED ORDER — IOPAMIDOL (ISOVUE-370) INJECTION 76%
75.0000 mL | Freq: Once | INTRAVENOUS | Status: AC | PRN
  Administered 2018-12-04: 75 mL via INTRAVENOUS

## 2018-12-05 ENCOUNTER — Telehealth: Payer: Self-pay | Admitting: *Deleted

## 2018-12-05 NOTE — Telephone Encounter (Signed)
-----   Message from Drema DallasAdam R Jaffe, DO sent at 12/04/2018  3:40 PM EST ----- CTA of head shows nothing concerning such as aneurysm or other potentially dangerous cause for headache.  It shows a normal variant regarding one of her blood vessels but nothing that I think would be dangerous or a cause for headache.

## 2018-12-05 NOTE — Telephone Encounter (Signed)
Left message giving patient results.  

## 2019-01-09 ENCOUNTER — Ambulatory Visit: Payer: Self-pay | Admitting: Neurology

## 2019-01-10 ENCOUNTER — Ambulatory Visit: Payer: Self-pay | Admitting: Neurology

## 2019-02-14 NOTE — Progress Notes (Signed)
NEUROLOGY FOLLOW UP OFFICE NOTE  Nicia Lavigna [id number]  HISTORY OF PRESENT ILLNESS: Kathleen Gutierrez is a 48 year old right-handed woman who follows up for headaches.  UPDATE: Last visit, Fioricet was discontinued and she was started on amitriptyline. CTA of the head performed on 12/04/2018 was personally reviewed and was unremarkable with no evidence of aneurysm.  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 She stopped taking naproxen due to GI upset.  She reports worsening vivid dreams that are aggravating her headaches.  She denies depression now.  But she still reports significant stress related to caring for her sick mother with dementia.   Intensity:  severe Duration:  2 hours with naproxen Frequency:  3 days a week. Current NSAIDS:   None Current analgesics:   None Current triptans: None Current ergotamine: None Current anti-emetic: None Current muscle relaxants: None Current anti-anxiolytic:  Xanax Current sleep aide: None Current Antihypertensive medications: None Current Antidepressant medications:  Amitriptyline 10 mg at bedtime Current Anticonvulsant medications: None Current anti-CGRP: None Current Vitamins/Herbal/Supplements: None Current Antihistamines/Decongestants: None Other therapy: None Hormone/birth control: None  Caffeine: 1 to 2 cups of coffee daily Diet:  Drinks a lot of water daily Exercise:  Not routine Depression:  no; Anxiety:  Yes.   Other pain:  Over the past year, she endorsed bilateral knee pain and right hand pain.  X-rays of right hand and knee from 04/27/18 were unremarkable. Sleep hygiene:  Sleeps well but has vivid dreams.  HISTORY:  She started having new onset headaches in September 2019.    They are a severe sharp pain in the right occipital region and sometimes above right eye and into jaw.  There is no preceding aura.    They are associated with nausea, heart burn,  photophobia, phonophobia, blurred vision,burning and pain in hands.  There is no associated unilateral numbness or weakness..  It typically lasts all day (worse later in the day/night) and occurs 5 to 6 days a week.    They are triggered by using the phone.  It is relieved by rest.    Past NSAIDS:  Meloxicam, ibuprofen, naproxen (GI upset) Past analgesics:  tramadol, Excedrin, Fioricet Past abortive triptans: None Past abortive ergotamine: None Past muscle relaxants: None Past anti-emetic: None Past antihypertensive medications: None Past antidepressant medications: None Past anticonvulsant medications: None Past anti-CGRP: None Past vitamins/Herbal/Supplements: None Past antihistamines/decongestants:  none Other past therapies:  none  She reports previous history of headaches but these are different.  PAST MEDICAL HISTORY: Past Medical History:  Diagnosis Date  . Headache     MEDICATIONS: Current Outpatient Medications on File Prior to Visit  Medication Sig Dispense Refill  . ALPRAZolam (XANAX) 0.5 MG tablet Take 1 tablet (0.5 mg total) by mouth at bedtime as needed for anxiety. 20 tablet 0  . amitriptyline (ELAVIL) 10 MG tablet Take 1 tablet (10 mg total) by mouth at bedtime. 30 tablet 3  . meloxicam (MOBIC) 7.5 MG tablet Take 1 tablet (7.5 mg total) by mouth daily. (Patient not taking: Reported on 11/13/2018) 30 tablet 2  . Multiple Vitamin (MULTIVITAMIN WITH MINERALS) TABS tablet Take 1 tablet by mouth daily.    . naproxen (NAPROSYN) 500 MG tablet Take 1 tablet (500 mg total) by mouth every 12 (twelve) hours as needed. 20 tablet 3  . omega-3 acid ethyl esters (LOVAZA) 1 g capsule Take 1 g by mouth 2 (two) times daily.    . Omega-3 Fatty Acids (OMEGA-3 CF  PO) Take by mouth.     No current facility-administered medications on file prior to visit.     ALLERGIES: No Known Allergies  FAMILY HISTORY: Family History  Problem Relation Age of Onset  . Hypertension Mother   .  Diabetes Father   . Pancreatic cancer Father   . Hypertension Sister   . Diabetes Sister   . Breast cancer Sister   . Hypertension Brother   . Diabetes Brother   . Diabetes Sister   . Diabetes Brother    SOCIAL HISTORY: Social History   Socioeconomic History  . Marital status: Married    Spouse name: Not on file  . Number of children: 3  . Years of education: Not on file  . Highest education level: 12th grade  Occupational History  . Occupation: unemployed  Social Needs  . Financial resource strain: Not on file  . Food insecurity:    Worry: Not on file    Inability: Not on file  . Transportation needs:    Medical: Not on file    Non-medical: Not on file  Tobacco Use  . Smoking status: Never Smoker  . Smokeless tobacco: Never Used  Substance and Sexual Activity  . Alcohol use: No  . Drug use: No  . Sexual activity: Yes  Lifestyle  . Physical activity:    Days per week: Not on file    Minutes per session: Not on file  . Stress: Not on file  Relationships  . Social connections:    Talks on phone: Not on file    Gets together: Not on file    Attends religious service: Not on file    Active member of club or organization: Not on file    Attends meetings of clubs or organizations: Not on file    Relationship status: Not on file  . Intimate partner violence:    Fear of current or ex partner: Not on file    Emotionally abused: Not on file    Physically abused: Not on file    Forced sexual activity: Not on file  Other Topics Concern  . Not on file  Social History Narrative   Does not worked - stays at home with 52 year old daughter.      Patient is right-handed. She lives with her boyfriend in a one level home. She drinks one cup of coffee a day. She does not exercise.    REVIEW OF SYSTEMS: Constitutional: No fevers, chills, or sweats, no generalized fatigue, change in appetite Eyes: No visual changes, double vision, eye pain Ear, nose and throat: No hearing  loss, ear pain, nasal congestion, sore throat Cardiovascular: No chest pain, palpitations Respiratory:  No shortness of breath at rest or with exertion, wheezes GastrointestinaI: No nausea, vomiting, diarrhea, abdominal pain, fecal incontinence Genitourinary:  No dysuria, urinary retention or frequency Musculoskeletal:  No neck pain, back pain Integumentary: No rash, pruritus, skin lesions Neurological: as above Psychiatric: No depression, insomnia, anxiety Endocrine: No palpitations, fatigue, diaphoresis, mood swings, change in appetite, change in weight, increased thirst Hematologic/Lymphatic:  No purpura, petechiae. Allergic/Immunologic: no itchy/runny eyes, nasal congestion, recent allergic reactions, rashes  PHYSICAL EXAM: Blood pressure 120/86, pulse 67, height 5' (1.524 m), weight 168 lb (76.2 kg), SpO2 99 %. General: No acute distress.  Patient appears well-groomed.   Head:  Normocephalic/atraumatic Eyes:  Fundi examined but not visualized Neck: supple, no paraspinal tenderness, full range of motion Heart:  Regular rate and rhythm Lungs:  Clear to auscultation  bilaterally Back: No paraspinal tenderness Neurological Exam: alert and oriented to person, place, and time. Attention span and concentration intact, recent and remote memory intact, fund of knowledge intact.  Speech fluent and not dysarthric, language intact.  CN II-XII intact. Bulk and tone normal, muscle strength 5/5 throughout.  Sensation to light touch intact.  Deep tendon reflexes 2+ throughout, toes downgoing.  Finger to nose and heel to shin testing intact.  Gait normal, Romberg negative.  IMPRESSION: Migraine without aura, without status migrainosus, not intractable  PLAN: 1.  Amitriptyline may be worsening her nightmares.  She will stop amitriptyline.  For preventative management, she will start topiramate 25mg  at bedtime.  May increase dose to 50mg  at bedtime in 4 weeks if needed 2.  For abortive therapy,  sumatriptan 100mg  3.  Limit use of pain relievers to no more than 2 days out of week to prevent risk of rebound or medication-overuse headache. 4.  Keep headache diary 5.  Exercise, hydration, caffeine cessation, sleep hygiene, monitor for and avoid triggers 6.  Consider:  magnesium citrate 400mg  daily, riboflavin 400mg  daily, and coenzyme Q10 100mg  three times daily 7.  Follow up in 4 months  Shon Millet, DO  CC: Georgina Quint, MD

## 2019-02-16 ENCOUNTER — Encounter: Payer: Self-pay | Admitting: Neurology

## 2019-02-16 ENCOUNTER — Ambulatory Visit (INDEPENDENT_AMBULATORY_CARE_PROVIDER_SITE_OTHER): Payer: Self-pay | Admitting: Neurology

## 2019-02-16 ENCOUNTER — Other Ambulatory Visit: Payer: Self-pay

## 2019-02-16 VITALS — BP 120/86 | HR 67 | Ht 60.0 in | Wt 168.0 lb

## 2019-02-16 DIAGNOSIS — G43009 Migraine without aura, not intractable, without status migrainosus: Secondary | ICD-10-CM

## 2019-02-16 MED ORDER — TOPIRAMATE 25 MG PO TABS: 25.0000 mg | ORAL_TABLET | Freq: Every day | ORAL | 3 refills | Status: DC

## 2019-02-16 MED ORDER — SUMATRIPTAN SUCCINATE 100 MG PO TABS: ORAL_TABLET | ORAL | 3 refills | Status: DC

## 2019-02-16 NOTE — Patient Instructions (Addendum)
1.  Stop amitriptyline.  Instead, start topiramate 25mg  at bedtime.  If you want to increase dose in 4 weeks, contact me. 2.  When you get a headache, take sumatriptan 100mg  at earliest onset.  May repeat dose once after 2 hours if needed (maximum 2 tablets in 24 hours) 3.  Limit use of pain relievers to no more than 2 days out of week to prevent risk of rebound or medication-overuse headache. 4.  Keep headache diary 5.  Exercise, hydration, caffeine cessation, sleep hygiene, monitor for and avoid triggers 6.  Consider:  magnesium citrate 400mg  daily, riboflavin 400mg  daily, and coenzyme Q10 100mg  three times daily 7.  Follow up in 4 months  Detener la amitriptilina. En su lugar, comience a tomar 25 mg de topiramato antes de acostarse. Si desea aumentar la dosis en 4 semanas, contcteme.  Cuando tenga dolor de cabeza, tome sumatriptn de 100 mg lo antes posible. Puede repetir la dosis una vez despus de 2 horas si es necesario (mximo 2 tabletas en 24 horas)  Limite el uso de analgsicos a no ms de 2 das a la semana para Multimedia programmer riesgo de rebote o dolor de cabeza por uso excesivo de medicamentos.  Lleve un diario de dolor de cabeza  Ejercicio, hidratacin, cesacin de cafena, higiene del sueo, control y evitacin de desencadenantes.  Considere: citrato de magnesio 400 mg al da, riboflavina 400 mg al da y coenzima Q10 100 mg tres veces al [employer] en 4 meses.

## 2019-02-19 ENCOUNTER — Telehealth: Payer: Self-pay | Admitting: Neurology

## 2019-02-19 NOTE — Telephone Encounter (Addendum)
Called Pt to advise her, per Dr Everlena Cooper, she will need to give the medication more time to work. Pt stated she was at the vet with her cats and asked I call her back. I advised the Pt to call us back when she is finished at the vet visit.

## 2019-02-19 NOTE — Telephone Encounter (Signed)
Patient called regarding regarding her Topiramate medication and Sumatriptan medication. She said the Topiramate did not help her, it made her feel worse. She said that the Sumatriptan  Did help. She is asking to be seen for a Follow up. Please Advise. Thanks

## 2019-02-20 ENCOUNTER — Telehealth: Payer: Self-pay | Admitting: Neurology

## 2019-02-20 DIAGNOSIS — G43009 Migraine without aura, not intractable, without status migrainosus: Secondary | ICD-10-CM

## 2019-02-20 NOTE — Telephone Encounter (Signed)
Patient said "it makes her feel like she is losing it" She said it keeps her up at night. She said that it makes her a bad person. She said that she takes the Amitriptyline she feels good during the day. I instructed her to continue the medication for 1 Month per [facility]. She said she cannot keep taking the other medication due to" feeling miserable and crazy". She feels the medication is worse than her headache. Thanks

## 2019-02-21 ENCOUNTER — Ambulatory Visit: Payer: Self-pay | Admitting: Neurology

## 2019-02-21 MED ORDER — RIZATRIPTAN BENZOATE 10 MG PO TBDP: 10.0000 mg | ORAL_TABLET | ORAL | 3 refills | Status: DC | PRN

## 2019-02-21 NOTE — Telephone Encounter (Signed)
Called and spoke with Pt. After taking the topiramate for 2 nights, she was unable to fall asleep for several hours, she woke up with headaches, she took the sumatriptan it made her feel awful. She took the amitriptyline again, it made her happy again, she only has a small headache, she is able to go to the gym. She failed to mention to you when she saw you that when she had her baby a few years ago, it was in the NICU for several months and she believes she may have PTSD from that, and could that be causing her headaches? She wants something else to take for when she has a headache, she can't tolerate sumatriptan. It sounds like she may want to remain on amitriptyline.

## 2019-02-21 NOTE — Telephone Encounter (Signed)
Called and spoke with Pt, advised her of rizatriptan and amitriptyline. She will call when needs refill for amtriptyline.

## 2019-02-21 NOTE — Telephone Encounter (Signed)
Any emotional stress is a trigger/contributer to headaches.  She will stop topiramate and restart amitriptyline 10mg  at bedtime.  Instead of sumatriptan, she can try rizatriptan 10mg  (may repeat once after 2 hours if needed, maximum 2 tablets in 24 hours)

## 2019-03-12 ENCOUNTER — Telehealth: Payer: Self-pay | Admitting: Neurology

## 2019-03-12 NOTE — Telephone Encounter (Signed)
Patient is needing the medication refill. I could not understand the name but it sounded like sumatriptan to the pharm on file. Thanks!

## 2019-03-13 MED ORDER — PROPRANOLOL HCL 20 MG PO TABS: 20.0000 mg | ORAL_TABLET | Freq: Two times a day (BID) | ORAL | 3 refills | Status: DC

## 2019-03-13 NOTE — Telephone Encounter (Signed)
Called and advised Pt 

## 2019-03-13 NOTE — Telephone Encounter (Signed)
Pt states the amitriptyline is helping the headaches, she feels good during the day, but having trouble sleeping, having nightmares. She would like to try something else

## 2019-03-13 NOTE — Telephone Encounter (Signed)
She can try propranolol.  If she can afford propranolol ER, then I would start 60mg  daily.  Otherwise, she can start IR 20mg  twice daily.  If headaches not improved in 4 weeks, she should contact us and we can increase dose.  Side effects may be lightheadedness but I am starting her on a low dose.  If she feels lightheaded, she may contact us.

## 2019-03-22 ENCOUNTER — Encounter (HOSPITAL_COMMUNITY): Payer: Self-pay | Admitting: Emergency Medicine

## 2019-03-22 ENCOUNTER — Other Ambulatory Visit: Payer: Self-pay

## 2019-03-22 ENCOUNTER — Emergency Department (HOSPITAL_COMMUNITY)
Admission: EM | Admit: 2019-03-22 | Discharge: 2019-03-22 | Disposition: A | Discharge status: Home / Self Care | Payer: Self-pay | Attending: Emergency Medicine | Admitting: Emergency Medicine

## 2019-03-22 DIAGNOSIS — Z79899 Other long term (current) drug therapy: Secondary | ICD-10-CM | POA: Insufficient documentation

## 2019-03-22 DIAGNOSIS — G43009 Migraine without aura, not intractable, without status migrainosus: Secondary | ICD-10-CM | POA: Insufficient documentation

## 2019-03-22 DIAGNOSIS — R195 Other fecal abnormalities: Secondary | ICD-10-CM | POA: Insufficient documentation

## 2019-03-22 LAB — POCT I-STAT EG7
Acid-base deficit: 1 mmol/L (ref 0.0–2.0)
Bicarbonate: 26 mmol/L (ref 20.0–28.0)
Calcium, Ion: 1.2 mmol/L (ref 1.15–1.40)
HCT: 34 % — ABNORMAL LOW (ref 36.0–46.0)
Hemoglobin: 11.6 g/dL — ABNORMAL LOW (ref 12.0–15.0)
O2 Saturation: 35 %
Potassium: 4.3 mmol/L (ref 3.5–5.1)
Sodium: 142 mmol/L (ref 135–145)
TCO2: 27 mmol/L (ref 22–32)
pCO2, Ven: 49.9 mmHg (ref 44.0–60.0)
pH, Ven: 7.324 (ref 7.250–7.430)
pO2, Ven: 23 mmHg — CL (ref 32.0–45.0)

## 2019-03-22 LAB — I-STAT BETA HCG BLOOD, ED (MC, WL, AP ONLY): I-stat hCG, quantitative: <5 m[IU]/mL (ref <5)

## 2019-03-22 MED ORDER — SODIUM CHLORIDE 0.9 % IV BOLUS
1000.0000 mL | Freq: Once | INTRAVENOUS | Status: AC
  Administered 2019-03-22: 1000 mL via INTRAVENOUS

## 2019-03-22 MED ORDER — DEXAMETHASONE SODIUM PHOSPHATE 10 MG/ML IJ SOLN
4.0000 mg | Freq: Once | INTRAMUSCULAR | Status: AC
  Administered 2019-03-22: 4 mg via INTRAVENOUS
  Filled 2019-03-22: qty 1

## 2019-03-22 MED ORDER — PROMETHAZINE HCL 25 MG/ML IJ SOLN
25.0000 mg | Freq: Once | INTRAMUSCULAR | Status: AC
  Administered 2019-03-22: 25 mg via INTRAVENOUS
  Filled 2019-03-22: qty 1

## 2019-03-22 MED ORDER — MAGNESIUM SULFATE 2 GM/50ML IV SOLN
2.0000 g | Freq: Once | INTRAVENOUS | Status: AC
  Administered 2019-03-22: 15:00:00 2 g via INTRAVENOUS
  Filled 2019-03-22: qty 50

## 2019-03-22 NOTE — ED Triage Notes (Signed)
Pt reports that she has been having headaches and her PCP put her on medications that arent helping. Reports she has called them and left messages but didn't call back until today. Pt having stools on Monday. Denies taking blood thinners

## 2019-03-22 NOTE — Discharge Instructions (Signed)
Continue your current meds.   Call your neurologist about your adjusting your medicines   See your neurologist   Return to ER if you have worse headaches, abdominal pain, vomiting, fever

## 2019-03-22 NOTE — ED Notes (Signed)
MD AND RN NOTIFIED OF PATIENT'S PO2 LEVEL OF 23

## 2019-03-22 NOTE — ED Provider Notes (Signed)
[facility]-EMERGENCY DEPT Provider Note   CSN: [id number] Arrival date & time: 03/22/19  1351    History   Chief Complaint Chief Complaint  Patient presents with  . Headache  . dark stools    HPI Kathleen Gutierrez is a 48 y.o. female history of migraines here presenting with headaches, diarrhea.  Patient has history of migraines and has seen [place] neurology for the last several months.  Patient had a CTA was unremarkable several months ago.  Patient has been put on amitriptyline but it gave her severe nightmares.  Patient also is put on Topamax as well as Imitrex as needed.  Patient states that for the last 4 to 5 days, her headache has been persistently getting worse despite taking these medicines as prescribed.  She states that the headache is throbbing on the right side and associated with some photophobia.  She states that she has been unable to sleep due to the headaches.  She denies any fevers or chills or neck pain.  Patient denies any vomiting.  Of note patient did eat some abdominal several days ago and has been having some abdominal cramps as well as loose stools.  She states that the stools are dark and mixed in with some diarrhea.  She denies any obvious blood in her stools.  Patient denies being on a blood thinner.     The history is provided by the patient.    Past Medical History:  Diagnosis Date  . Headache     Patient Active Problem List   Diagnosis Date Noted  . Acute non intractable tension-type headache 09/14/2018  . Situational anxiety 09/14/2018    Past Surgical History:  Procedure Laterality Date  . NO PAST SURGERIES       OB History    Gravida  5   Para  3   Term  2   Preterm  1   AB  2   Living  1     SAB  2   TAB      Ectopic      Multiple  0   Live Births  1            Home Medications    Prior to Admission medications   Medication Sig Start Date End Date Taking? Authorizing Provider   ALPRAZolam Prudy Feeler) 0.5 MG tablet Take 1 tablet (0.5 mg total) by mouth at bedtime as needed for anxiety. 10/10/18   Georgina Quint, MD  meloxicam (MOBIC) 7.5 MG tablet Take 1 tablet (7.5 mg total) by mouth daily. Patient not taking: Reported on 11/13/2018 04/27/18   Wallis Bamberg, PA-C  Multiple Vitamin (MULTIVITAMIN WITH MINERALS) TABS tablet Take 1 tablet by mouth daily.    [provider]  omega-3 acid ethyl esters (LOVAZA) 1 g capsule Take 1 g by mouth 2 (two) times daily.    [provider]  Omega-3 Fatty Acids (OMEGA-3 CF PO) Take by mouth.    [provider]  propranolol (INDERAL) 20 MG tablet Take 1 tablet (20 mg total) by mouth 2 (two) times daily. 03/13/19   Drema Dallas, DO  rizatriptan (MAXALT-MLT) 10 MG disintegrating tablet Take 1 tablet (10 mg total) by mouth as needed for migraine. May repeat in 2 hours if needed 02/21/19   Drema Dallas, DO  SUMAtriptan (IMITREX) 100 MG tablet Take 1 tablet earliest onset of migraine.  May repeat x1 in 2 hours if headache persists or recurs. 02/16/19  Everlena CooperJaffe, Adam R, DO  topiramate (TOPAMAX) 25 MG tablet Take 1 tablet (25 mg total) by mouth at bedtime. 02/16/19   Drema DallasJaffe, Adam R, DO    Family History Family History  Problem Relation Age of Onset  . Hypertension Mother   . Diabetes Father   . Pancreatic cancer Father   . Hypertension Sister   . Diabetes Sister   . Breast cancer Sister   . Hypertension Brother   . Diabetes Brother   . Diabetes Sister   . Diabetes Brother     Social History Social History   Tobacco Use  . Smoking status: Never Smoker  . Smokeless tobacco: Never Used  Substance Use Topics  . Alcohol use: No  . Drug use: No     Allergies   Patient has no known allergies.   Review of Systems Review of Systems  Neurological: Positive for headaches.  All other systems reviewed and are negative.    Physical Exam Updated Vital Signs BP 133/78 (BP Location: Right Arm)   Pulse 72    Temp 98.3 F (36.8 C) (Oral)   Resp 18   LMP 03/08/2019   SpO2 100%   Physical Exam Vitals signs and nursing note reviewed.  Constitutional:      Comments: Slightly uncomfortable, holding her head   HENT:     Head: Normocephalic.  Eyes:     Extraocular Movements: Extraocular movements intact.  Neck:     Musculoskeletal: Normal range of motion and neck supple.     Comments: No meningeal signs  Cardiovascular:     Rate and Rhythm: Normal rate and regular rhythm.     Heart sounds: Normal heart sounds.  Pulmonary:     Effort: Pulmonary effort is normal.     Breath sounds: Normal breath sounds.  Abdominal:     General: Bowel sounds are normal.  Musculoskeletal: Normal range of motion.  Skin:    General: Skin is warm.     Capillary Refill: Capillary refill takes less than 2 seconds.  Neurological:     Mental Status: She is alert and oriented to person, place, and time.     Cranial Nerves: No cranial nerve deficit or dysarthria.     Sensory: No sensory deficit.     Motor: No weakness.     Coordination: Romberg sign negative. Coordination normal.     Comments: CN 2- 12 intact, nl strength and sensation throughout. Nl finger to nose bilaterally   Psychiatric:        Mood and Affect: Mood normal.        Behavior: Behavior normal.      ED Treatments / Results  Labs (all labs ordered are listed, but only abnormal results are displayed) Labs Reviewed  CBC WITH DIFFERENTIAL/PLATELET  COMPREHENSIVE METABOLIC PANEL  I-STAT BETA HCG BLOOD, ED ([facility], [facility], [facility] ONLY)    EKG None  Radiology No results found.  Procedures Procedures (including critical care time)  Medications Ordered in ED Medications  sodium chloride 0.9 % bolus 1,000 mL (has no administration in time range)  promethazine (PHENERGAN) injection 25 mg (has no administration in time range)  dexamethasone (DECADRON) injection 4 mg (has no administration in time range)  magnesium sulfate IVPB 2 g 50 mL (has no  administration in time range)     Initial Impression / Assessment and Plan / ED Course  I have reviewed the triage vital signs and the nursing notes.  Pertinent labs & imaging results that were available during  my care of the patient were reviewed by me and considered in my medical decision making (see chart for details).       Kathleen Gutierrez is a 48 y.o. female here with headaches. Has hx of migraines and on multiple meds as prescribed by neurology for her headaches. Also may have mild gastroenteritis symptoms that can make her headaches worse. Nl neuro exam so will hold off on neuro imaging. Will get labs, hydrate, give migraine cocktail and reassess.   4:21 PM Hg stable at 11.6, close to baseline. I think likely has mild gastroenteritis. Headache improved with migraine cocktail. Told her to call [facility] neurology regarding adjusting her medicines. Stable for discharge.    Final Clinical Impressions(s) / ED Diagnoses   Final diagnoses:  None    ED Discharge Orders    None       Charlynne Pander, MD 03/22/19 1623

## 2019-03-27 ENCOUNTER — Ambulatory Visit: Payer: Self-pay | Admitting: Neurology

## 2019-03-27 ENCOUNTER — Telehealth: Payer: Self-pay | Admitting: Neurology

## 2019-03-27 DIAGNOSIS — F419 Anxiety disorder, unspecified: Secondary | ICD-10-CM

## 2019-03-27 NOTE — Addendum Note (Signed)
Addended by: Horatio Pel on: 03/27/2019 12:59 PM   Modules accepted: Orders

## 2019-03-27 NOTE — Telephone Encounter (Signed)
Pt was given cocktail in ED yesterday.  pls advise.

## 2019-03-27 NOTE — Telephone Encounter (Signed)
Spoke with pt relaying message below.  She states that the Rizatriptan is cost prohibitive as it is $80 for 12 tablets.  Asks if OK to take more than 2 tabs daily.  I advised against that.  Pt states that #12 tablets is only covering 6 days, and she cannot justify spending $80 every week.   I advised that Rizatriptan is not for every day use, but did state that I would send message to Dr. Everlena Cooper for recommendations.   Pt agreeable to counseling for anxiety.  Will send referral now.

## 2019-03-27 NOTE — Telephone Encounter (Signed)
Patient called regarding having went to the ER last Thursday 03/22/2019 for her headache. She said they gave her some medication "to relax her brain". She said she felt better after the medication. She said now she feels like her BP is out of control and she feels lightheaded. She said she feels like the medication she is on makes her BP high. She would like a call back. Thanks

## 2019-03-27 NOTE — Telephone Encounter (Signed)
The last medication that I prescribed her is a medication to lower blood pressure.  I think the problem is her anxiety and she needs to see somebody for her anxiety.  She needs to give the medications a chance.  We already tried 3 different medications.

## 2019-06-13 NOTE — Progress Notes (Signed)
Virtual Visit via Video Note The purpose of this virtual visit is to provide medical care while limiting exposure to the novel coronavirus.    Consent was obtained for video visit:  Yes Answered questions that patient had about telehealth interaction:  Yes I discussed the limitations, risks, security and privacy concerns of performing an evaluation and management service by telemedicine. I also discussed with the patient that there may be a patient responsible charge related to this service. The patient expressed understanding and agreed to proceed.  Pt location: Home Physician Location: Home Name of referring provider:  Georgina QuintSagardia, Miguel Jose, * I connected with Clarene Reamerna Maria Gutierrez at patients initiation/request on 06/14/2019 at  1:30 PM EDT by video enabled telemedicine application and verified that I am speaking with the correct person using two identifiers. Pt MRN:  [id number] Pt DOB:  09/20/1971 Video Participants:  Clarene ReamerAna Maria Gutierrez   History of Present Illness:  Kathleen Gutierrez is a 48 year old right-handed woman who follows up for headaches.  UPDATE: In March, amitriptyline was stopped because she said it contributed to nightmares.  She was started on topiramate 25mg  but stopped after a couple of nights because she said it contributed to difficulty falling asleep.  She decided to restart amitriptyline.  She didn't like the way sumatriptan made her feel, so she was switched to Maxalt.  She started having nightmares again, so amitriptyline was switched to propranolol 20mg  twice daily.  She presented to the ED on 03/22/19 for headache as well as possible gastroenteritis.  She was hydrated and given a headache cocktail.  She started magnesium as well.  Intensity:  severe Duration:  2 hours with naproxen Frequency:  3 to 4 days a month Current NSAIDS: Naproxen (avoids spicy food, so no longer upsets stomach). Current analgesics:  None Current triptans:None Current  ergotamine:None Current anti-emetic:None Current muscle relaxants:None Current anti-anxiolytic: Xanax Current sleep aide:None Current Antihypertensive medications:propranolol 20mg  twice daily Current Antidepressant medications: None Current Anticonvulsant medications:none Current anti-CGRP:None Current Vitamins/Herbal/Supplements:magnesium Current Antihistamines/Decongestants:None Other therapy:None Hormone/birth control: None  Caffeine:1 to 2 cups of coffee daily Diet:Drinks a lot of water daily Exercise:Not routine Depression:no; Anxiety: improved Other pain: Over the past year, she endorsed bilateral knee pain and right hand pain. X-rays of right hand and knee from 04/27/18 were unremarkable. Sleep hygiene:Sleeps well but has vivid dreams.  HISTORY:  Past history of headaches.  She started having new onset headaches in September 2019.   They are a severesharp pain in the right occipital region and sometimes above right eye and into jaw. There is no preceding aura.   They are associated with nausea, heart burn, photophobia, phonophobia, blurred vision,burning and pain in hands. There is no associated unilateral numbness or weakness.. It typically lasts all day (worse later in the day/night)and occurs 5 to 6 days a week.   They are triggered by using the phone. It is relieved by rest.   Past NSAIDS:Meloxicam, ibuprofen, naproxen (GI upset) Past analgesics:tramadol, Excedrin, Fioricet Past abortive triptans:sumatriptan 100mg  (made her feel awful), rizatriptan  (expensive) Past abortive ergotamine:None Past muscle relaxants:None Past anti-emetic:None Past antihypertensive medications:None Past antidepressant medications:amitriptyline 10mg  (nightmares) Past anticonvulsant medications:topiramate 25mg  (took for only a couple of nights) Past anti-CGRP:None Past vitamins/Herbal/Supplements:None Past antihistamines/decongestants:none  Other past therapies:none  CTA of the head performed on 12/04/2018 was personally reviewed and was unremarkable with no evidence of aneurysm.  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  Past Medical History: Past Medical History:  Diagnosis  Date  . Headache     Medications: Outpatient Encounter Medications as of 06/14/2019  Medication Sig  . ALPRAZolam (XANAX) 0.5 MG tablet Take 1 tablet (0.5 mg total) by mouth at bedtime as needed for anxiety. (Patient taking differently: Take 0.5 mg by mouth 2 (two) times daily as needed for anxiety. )  . meloxicam (MOBIC) 7.5 MG tablet Take 1 tablet (7.5 mg total) by mouth daily. (Patient not taking: Reported on 11/13/2018)  . naproxen (NAPROSYN) 250 MG tablet Take 250 mg by mouth 2 (two) times daily as needed for mild pain.  Marland Kitchen. propranolol (INDERAL) 20 MG tablet Take 1 tablet (20 mg total) by mouth 2 (two) times daily.  . rizatriptan (MAXALT-MLT) 10 MG disintegrating tablet Take 1 tablet (10 mg total) by mouth as needed for migraine. May repeat in 2 hours if needed (Patient not taking: Reported on 03/22/2019)  . SUMAtriptan (IMITREX) 100 MG tablet Take 1 tablet earliest onset of migraine.  May repeat x1 in 2 hours if headache persists or recurs. (Patient not taking: Reported on 03/22/2019)  . topiramate (TOPAMAX) 25 MG tablet Take 1 tablet (25 mg total) by mouth at bedtime. (Patient not taking: Reported on 03/22/2019)   No facility-administered encounter medications on file as of 06/14/2019.     Allergies: No Known Allergies  Family History: Family History  Problem Relation Age of Onset  . Hypertension Mother   . Diabetes Father   . Pancreatic cancer Father   . Hypertension Sister   . Diabetes Sister   . Breast cancer Sister   . Hypertension Brother   . Diabetes Brother   . Diabetes Sister   . Diabetes Brother     Social History: Social History   Socioeconomic History  . Marital status: Married     Spouse name: Not on file  . Number of children: 3  . Years of education: Not on file  . Highest education level: 12th grade  Occupational History  . Occupation: unemployed  Social Needs  . Financial resource strain: Not on file  . Food insecurity    Worry: Not on file    Inability: Not on file  . Transportation needs    Medical: Not on file    Non-medical: Not on file  Tobacco Use  . Smoking status: Never Smoker  . Smokeless tobacco: Never Used  Substance and Sexual Activity  . Alcohol use: No  . Drug use: No  . Sexual activity: Yes  Lifestyle  . Physical activity    Days per week: Not on file    Minutes per session: Not on file  . Stress: Not on file  Relationships  . Social Musicianconnections    Talks on phone: Not on file    Gets together: Not on file    Attends religious service: Not on file    Active member of club or organization: Not on file    Attends meetings of clubs or organizations: Not on file    Relationship status: Not on file  . Intimate partner violence    Fear of current or ex partner: Not on file    Emotionally abused: Not on file    Physically abused: Not on file    Forced sexual activity: Not on file  Other Topics Concern  . Not on file  Social History Narrative   Does not worked - stays at home with 46 year old daughter.      Patient is right-handed. She lives with her boyfriend in  a one level home. She drinks one cup of coffee a day. She does not exercise.   ations/Objective:   Height 5' (1.524 m), weight 170 lb (77.1 kg). No acute distress.  Alert and oriented.  Speech fluent and not dysarthric.  Language intact.  Face symmetric.    Assessment and Plan:   Migraine without aura, without status migrainosus, not intractable, improved  1.  For preventative management, propranolol 20mg  twice daily and magnesium 2.  For abortive therapy, naproxen 3.  Limit use of pain relievers to no more than 2 days out of week to prevent risk of rebound or  medication-overuse headache. 4.  Keep headache diary 5.  Exercise, hydration, caffeine cessation, sleep hygiene, monitor for and avoid triggers 6.  Consider:  magnesium citrate 400mg  daily, riboflavin 400mg  daily, and coenzyme Q10 100mg  three times daily 7. Always keep in mind that currently taking a hormone or birth control may be a possible trigger or aggravating factor for migraine. 8. Follow up 4 months  Follow Up Instructions:    -I discussed the assessment and treatment plan with the patient. The patient was provided an opportunity to ask questions and all were answered. The patient agreed with the plan and demonstrated an understanding of the instructions.   The patient was advised to call back or seek an in-person evaluation if the symptoms worsen or if the condition fails to improve as anticipated.   Dudley Major, DO

## 2019-06-14 ENCOUNTER — Encounter: Payer: Self-pay | Admitting: Neurology

## 2019-06-14 ENCOUNTER — Telehealth (INDEPENDENT_AMBULATORY_CARE_PROVIDER_SITE_OTHER): Payer: Self-pay | Admitting: Neurology

## 2019-06-14 ENCOUNTER — Other Ambulatory Visit: Payer: Self-pay

## 2019-06-14 VITALS — Ht 60.0 in | Wt 170.0 lb

## 2019-06-14 DIAGNOSIS — G43009 Migraine without aura, not intractable, without status migrainosus: Secondary | ICD-10-CM

## 2019-07-14 ENCOUNTER — Other Ambulatory Visit: Payer: Self-pay | Admitting: Neurology

## 2019-07-16 ENCOUNTER — Telehealth: Payer: Self-pay | Admitting: Neurology

## 2019-07-16 NOTE — Telephone Encounter (Signed)
Rx has been sent in. 

## 2019-07-16 NOTE — Telephone Encounter (Signed)
Patient left msg with after hours about running out of meds and not getting her refill. She has a headache. The medication is propanalol 20mg  BID at [facility] [phone].

## 2019-07-18 ENCOUNTER — Telehealth: Payer: Self-pay | Admitting: Neurology

## 2019-07-18 MED ORDER — PROPRANOLOL HCL 20 MG PO TABS: 20.0000 mg | ORAL_TABLET | Freq: Two times a day (BID) | ORAL | 3 refills | Status: DC

## 2019-07-18 NOTE — Addendum Note (Signed)
Addended by: Ranae Plumber on: 07/18/2019 03:26 PM   Modules accepted: Orders

## 2019-07-18 NOTE — Telephone Encounter (Signed)
Patient is needing a refill of the propanolol medication to the pharm on file. Thanks!

## 2019-07-18 NOTE — Telephone Encounter (Signed)
Called and advised Pt Rx was sent in 8/3 to [facility]

## 2019-07-18 NOTE — Telephone Encounter (Signed)
Resent Rx transmission failed

## 2019-07-23 ENCOUNTER — Telehealth: Payer: Self-pay | Admitting: Neurology

## 2019-07-23 NOTE — Telephone Encounter (Signed)
Called and spoke with Pt, questioned her if she has started any other medications, Pt states she has not.  Swelling started 4 days ago.  Nightmares are present constantly.

## 2019-07-23 NOTE — Telephone Encounter (Signed)
I question that the swelling is due to the propranolol since she has been on it for over a month and that is not typical side effect for propranolol.   Nonetheless, she should stop it and let us know if the the swelling resolves, at which point, we can start something else.  My recommendation would be an anti-CGRP.   If not, she should see a primary care provider.  The nightmares are not due to the propranolol.  That is likely related to her anxiety.

## 2019-07-23 NOTE — Telephone Encounter (Signed)
Propanolol medication is making her swollen within her hands/ feet/ and face, she can not sleep, having nightmares. Please call her back with what to do. Thanks!

## 2019-07-23 NOTE — Telephone Encounter (Signed)
Called and advised Pt.  She states swelling is only in the morning, by mid-day, the swelling is gone. She will stop propranolol and let us know when the swelling has resolved completely.  She will contact her PCP about the nightmares.

## 2019-07-26 NOTE — Telephone Encounter (Signed)
Patient left msg with after hours regarding this. She said she stopped taking her medication and was wanting to speak to someone about this. Thanks!

## 2019-07-27 NOTE — Telephone Encounter (Signed)
Patient is calling in again, She stopped taking the propanolol medication because she couldn't sleep. She is starting to have headaches again and now not taking any medications. Thanks!

## 2019-07-29 NOTE — Telephone Encounter (Signed)
Aimovig

## 2019-07-31 NOTE — Telephone Encounter (Signed)
Called and spoke with Pt. She does not have insurance and can not afford Aimovig.  She is feeling better now that she is off a lot of medicines.  She has an appt with a chiropractor tomorrow. She will call us back if needed.

## 2019-08-14 ENCOUNTER — Telehealth: Payer: Self-pay | Admitting: Neurology

## 2019-08-14 MED ORDER — ZONISAMIDE 25 MG PO CAPS: ORAL_CAPSULE | ORAL | 0 refills | Status: DC

## 2019-08-14 NOTE — Telephone Encounter (Signed)
don't see where acupuncture was ever mention in patient chart.  Tried calling patient for additional information no answer, left message to call office back  Any recommendations

## 2019-08-14 NOTE — Telephone Encounter (Signed)
Start zonisamide 25mg  daily for one week, then 50mg  daily for one week, then 75mg  daily for one week, then 100mg  daily.

## 2019-08-14 NOTE — Telephone Encounter (Signed)
Called patient she is aware  Pharmacy verified Rx sent

## 2019-08-14 NOTE — Telephone Encounter (Signed)
Patient called to let the doctor know the acupuncture is not working and she'd like to know what to do next.

## 2019-08-21 ENCOUNTER — Telehealth: Payer: Self-pay | Admitting: Neurology

## 2019-08-21 NOTE — Telephone Encounter (Addendum)
Patient called and left a message on 08/20/19 at 9:22am on the voice mail requesting a call back to discuss her medication. She did not leave a name of the medication in question but said she hasn't taken it because of trouble sleeping and feeling weak.

## 2019-08-21 NOTE — Telephone Encounter (Addendum)
She was feeling weak and poor memory with the zonisamide that she recently started a week ago. She stopped taking it.  She had a bad headache and took the propranolol which helped.    Dr. Tomi Likens wanted her to try Aimovig 70mg  monthly.  Patient said she doesn't have any insurance.

## 2019-08-21 NOTE — Telephone Encounter (Signed)
She may be talking about zonisamide.  Instead, we can try Aimovig 70mg .  It is a monthly injection that she gives herself every 30 days.  It is typically well-tolerated.

## 2019-08-21 NOTE — Telephone Encounter (Signed)
Call placed and informed patient Dr. Tomi Likens stated if she did not want to take anymore of the zonisamide that was fine.  Also to reminded patient to take the Propranolol 20 mg BID scheduled (that is how it is ordered  - not as needed). Verbalized understanding.

## 2019-08-21 NOTE — Telephone Encounter (Signed)
Then I would continue propranolol again, 20mg  twice daily (not used as needed).

## 2019-12-10 ENCOUNTER — Other Ambulatory Visit: Payer: Self-pay

## 2019-12-10 ENCOUNTER — Telehealth: Payer: Self-pay | Admitting: Neurology

## 2019-12-10 MED ORDER — PROPRANOLOL HCL 20 MG PO TABS: 20.0000 mg | ORAL_TABLET | Freq: Two times a day (BID) | ORAL | 3 refills | Status: DC

## 2019-12-10 NOTE — Telephone Encounter (Signed)
Patient called in needing to get a refill on her Propranolol. She uses Paediatric nurse on [address] and [address]. She also wanted to schedule a vv with Dr. Tomi Likens to have another Scan? She said she needs to talk with him regarding some things. Patient is scheduled for a vv. Thank you

## 2019-12-10 NOTE — Telephone Encounter (Signed)
I sent refill in for propranolol.

## 2019-12-11 ENCOUNTER — Other Ambulatory Visit: Payer: Self-pay | Admitting: Neurology

## 2019-12-21 NOTE — Progress Notes (Signed)
Virtual Visit via Video Note The purpose of this virtual visit is to provide medical care while limiting exposure to the novel coronavirus.    Consent was obtained for video visit:  Yes.   Answered questions that patient had about telehealth interaction:  Yes.   I discussed the limitations, risks, security and privacy concerns of performing an evaluation and management service by telemedicine. I also discussed with the patient that there may be a patient responsible charge related to this service. The patient expressed understanding and agreed to proceed.  Pt location: Home Physician Location: office Name of referring provider:  No ref. provider found I connected with Kathleen Gutierrez at patients initiation/request on 12/24/2019 at  1:10 PM EST by video enabled telemedicine application and verified that I am speaking with the correct person using two identifiers. Pt MRN:  [id number] Pt DOB:  11-Jan-1971 Video Participants:  Kathleen Gutierrez;   History of Present Illness:  Kathleen Gutierrez is a 49 year old right-handed woman who follows up for headaches.  UPDATE: Although helpful, she stopped propranolol because she said it caused swelling of her hands and feet.  I wanted to start Aimovig but she did not have insurance.  She said she felt better off of medication and saw a chiropractor and acupuncturist, which was ineffective.  I then started her on zonisamide but stopped after a week because it affected her memory and made her feel weak.  She then wished to restart propranolol.  Recently increased headaches.  May be attributed to recent passing of her sister from brain tumor.  Just started therapy with a counselor.    Intensity:severe Duration:2 hours with naproxen Frequency:3 to 4 days a month Current NSAIDS:Naproxen (avoids spicy food, so no longer upsets stomach). Current analgesics:None Current triptans:None Current ergotamine:None Current  anti-emetic:None Current muscle relaxants:None Current anti-anxiolytic: Xanax Current sleep aide:None Current Antihypertensive medications:propranolol 20mg  twice daily Current Antidepressant medications:None Current Anticonvulsant medications:none Current anti-CGRP:None Current Vitamins/Herbal/Supplements:magnesium Current Antihistamines/Decongestants:None Other therapy:None Hormone/birth control: None  Caffeine:1 to 2 cups of coffee daily Diet:Drinks a lot of water daily Exercise:Not routine Depression:no; Anxiety: improved Other pain: Over the past year, she endorsed bilateral knee pain and right hand pain. X-rays of right hand and knee from 04/27/18 were unremarkable. Sleep hygiene:Sleeps well but has vivid dreams.  HISTORY: Past history of headaches.  She started having new onset headaches in September 2019.They area severesharp pain in the right occipital region and sometimes above right eye and into jaw. There is no preceding aura.They are associated with nausea, heart burn, photophobia, phonophobia, blurred vision,burning and pain in hands. There is no associated unilateral numbness or weakness.. It typically lasts all day (worse later in the day/night)and occurs 5 to 6 days a week.They are triggered by using the phone. It is relieved by rest.   Past NSAIDS:Meloxicam, ibuprofen, naproxen (GI upset) Past analgesics:tramadol, Excedrin, Fioricet Past abortive triptans:sumatriptan 100mg  (made her feel awful), rizatriptan  (expensive) Past abortive ergotamine:None Past muscle relaxants:None Past anti-emetic:None Past antihypertensive medications:None Past antidepressant medications:amitriptyline 10mg  (nightmares) Past anticonvulsant medications:topiramate 25mg  (took for only a couple of nights because it caused difficulty falling asleep); zonisamide 25mg  (for one week, caused memory deficits and made her feel weak) Past  anti-CGRP:None Past vitamins/Herbal/Supplements:None Past antihistamines/decongestants:none Other past therapies:chiropractic medication, acupuncture  CTA of the head performed on 12/04/2018 was unremarkable with no evidence of aneurysm. 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  Past Medical History: Past Medical History:  Diagnosis Date  .  Headache     Medications: Outpatient Encounter Medications as of 12/24/2019  Medication Sig  . ALPRAZolam (XANAX) 0.5 MG tablet Take 1 tablet (0.5 mg total) by mouth at bedtime as needed for anxiety. (Patient taking differently: Take 0.5 mg by mouth 2 (two) times daily as needed for anxiety. )  . naproxen (NAPROSYN) 250 MG tablet Take 250 mg by mouth 2 (two) times daily as needed for mild pain.  Marland Kitchen propranolol (INDERAL) 20 MG tablet Take 1 tablet (20 mg total) by mouth 2 (two) times daily.  Marland Kitchen zonisamide (ZONEGRAN) 25 MG capsule 25mg  daily for one week, then 50mg  daily for one week, then 75mg  daily for one week, then 100mg  daily.   No facility-administered encounter medications on file as of 12/24/2019.    Allergies: No Known Allergies  Family History: Family History  Problem Relation Age of Onset  . Hypertension Mother   . Diabetes Father   . Pancreatic cancer Father   . Hypertension Sister   . Diabetes Sister   . Breast cancer Sister   . Hypertension Brother   . Diabetes Brother   . Diabetes Sister   . Diabetes Brother     Social History: Social History   Socioeconomic History  . Marital status: Married    Spouse name: Not on file  . Number of children: 3  . Years of education: Not on file  . Highest education level: 12th grade  Occupational History  . Occupation: unemployed  Tobacco Use  . Smoking status: Never Smoker  . Smokeless tobacco: Never Used  Substance and Sexual Activity  . Alcohol use: No  . Drug use: No  . Sexual activity: Yes  Other Topics Concern  . Not on  file  Social History Narrative   Does not worked - stays at home with 58 year old daughter.      Patient is right-handed. She lives with her boyfriend in a one level home. She drinks one cup of coffee a day. She does not exercise.   Social Determinants of Health   Financial Resource Strain:   . Difficulty of Paying Living Expenses: Not on file  Food Insecurity:   . Worried About Charity fundraiser in the Last Year: Not on file  . Ran Out of Food in the Last Year: Not on file  Transportation Needs:   . Lack of Transportation (Medical): Not on file  . Lack of Transportation (Non-Medical): Not on file  Physical Activity:   . Days of Exercise per Week: Not on file  . Minutes of Exercise per Session: Not on file  Stress:   . Feeling of Stress : Not on file  Social Connections:   . Frequency of Communication with Friends and Family: Not on file  . Frequency of Social Gatherings with Friends and Family: Not on file  . Attends Religious Services: Not on file  . Active Member of Clubs or Organizations: Not on file  . Attends Archivist Meetings: Not on file  . Marital Status: Not on file  Intimate Partner Violence:   . Fear of Current or Ex-Partner: Not on file  . Emotionally Abused: Not on file  . Physically Abused: Not on file  . Sexually Abused: Not on file    Observations/Objective:   Height 5' (1.524 m), weight 178 lb (80.7 kg). No acute distress.  Alert and oriented.  Speech fluent and not dysarthric.  Language intact.  Eyes orthophoric on primary gaze.  Face symmetric.  Assessment and Plan:   Migraine without aura, without status migrainosus, not intractable  1. Due to worsening headache and family history of brain tumor, will check MRI of brain with and without contrast.  2. For preventative management, continue propranolol 20mg  twice daily 3.  For abortive therapy, naproxen 4.  Limit use of pain relievers to no more than 2 days out of week to prevent risk of  rebound or medication-overuse headache. 5.  Keep headache diary 6.  Exercise, hydration, caffeine cessation, sleep hygiene, monitor for and avoid triggers 7. trigger or aggravating factor for migraine. 8. Follow up 5 months.   Follow Up Instructions:    -I discussed the assessment and treatment plan with the patient. The patient was provided an opportunity to ask questions and all were answered. The patient agreed with the plan and demonstrated an understanding of the instructions.   The patient was advised to call back or seek an in-person evaluation if the symptoms worsen or if the condition fails to improve as anticipated.    , DO

## 2019-12-24 ENCOUNTER — Other Ambulatory Visit: Payer: Self-pay

## 2019-12-24 ENCOUNTER — Encounter: Payer: Self-pay | Admitting: Neurology

## 2019-12-24 ENCOUNTER — Telehealth (INDEPENDENT_AMBULATORY_CARE_PROVIDER_SITE_OTHER): Payer: Self-pay | Admitting: Neurology

## 2019-12-24 VITALS — Ht 60.0 in | Wt 178.0 lb

## 2019-12-24 DIAGNOSIS — R519 Headache, unspecified: Secondary | ICD-10-CM

## 2019-12-24 DIAGNOSIS — G44209 Tension-type headache, unspecified, not intractable: Secondary | ICD-10-CM

## 2019-12-24 DIAGNOSIS — G43009 Migraine without aura, not intractable, without status migrainosus: Secondary | ICD-10-CM

## 2019-12-24 DIAGNOSIS — Z808 Family history of malignant neoplasm of other organs or systems: Secondary | ICD-10-CM

## 2020-01-17 ENCOUNTER — Ambulatory Visit
Admission: RE | Admit: 2020-01-17 | Discharge: 2020-01-17 | Disposition: A | Discharge status: Home / Self Care | Payer: No Typology Code available for payment source | Source: Ambulatory Visit | Attending: Neurology | Admitting: Neurology

## 2020-01-17 DIAGNOSIS — G44209 Tension-type headache, unspecified, not intractable: Secondary | ICD-10-CM

## 2020-01-17 MED ORDER — GADOBENATE DIMEGLUMINE 529 MG/ML IV SOLN
15.0000 mL | Freq: Once | INTRAVENOUS | Status: AC | PRN
  Administered 2020-01-17: 16:00:00 15 mL via INTRAVENOUS

## 2020-01-18 ENCOUNTER — Other Ambulatory Visit: Payer: Self-pay | Admitting: Neurology

## 2020-01-18 ENCOUNTER — Telehealth: Payer: Self-pay | Admitting: Neurology

## 2020-01-18 MED ORDER — PROPRANOLOL HCL 20 MG PO TABS: 20.0000 mg | ORAL_TABLET | Freq: Two times a day (BID) | ORAL | 3 refills | Status: DC

## 2020-01-18 NOTE — Progress Notes (Signed)
Sent refill of propranolol to [facility] pharmacy

## 2020-01-18 NOTE — Telephone Encounter (Signed)
Patient returned call re: MRI results.

## 2020-04-06 ENCOUNTER — Encounter (HOSPITAL_COMMUNITY): Payer: Self-pay

## 2020-04-06 ENCOUNTER — Other Ambulatory Visit: Payer: Self-pay

## 2020-04-06 ENCOUNTER — Emergency Department (HOSPITAL_COMMUNITY)
Admission: EM | Admit: 2020-04-06 | Discharge: 2020-04-06 | Disposition: A | Discharge status: Home / Self Care | Payer: No Typology Code available for payment source | Attending: Emergency Medicine | Admitting: Emergency Medicine

## 2020-04-06 DIAGNOSIS — Z5321 Procedure and treatment not carried out due to patient leaving prior to being seen by health care provider: Secondary | ICD-10-CM | POA: Insufficient documentation

## 2020-04-06 DIAGNOSIS — M545 Low back pain: Secondary | ICD-10-CM | POA: Insufficient documentation

## 2020-04-06 NOTE — ED Triage Notes (Addendum)
Lower back pain since last night. Says she has been at the gym a lot lately. Not sure if she did heavy lifting.

## 2020-04-06 NOTE — ED Triage Notes (Signed)
Pt reports hurt her back last Saturday, unable to sit or lay down due to the pain. Reports called her husband to come get her.

## 2020-04-07 ENCOUNTER — Ambulatory Visit (INDEPENDENT_AMBULATORY_CARE_PROVIDER_SITE_OTHER): Payer: Self-pay | Admitting: Registered Nurse

## 2020-04-07 ENCOUNTER — Other Ambulatory Visit: Payer: Self-pay

## 2020-04-07 ENCOUNTER — Encounter: Payer: Self-pay | Admitting: Registered Nurse

## 2020-04-07 VITALS — BP 117/79 | HR 74 | Temp 98.3°F | Resp 16 | Ht 60.0 in | Wt 173.4 lb

## 2020-04-07 DIAGNOSIS — M549 Dorsalgia, unspecified: Secondary | ICD-10-CM

## 2020-04-07 DIAGNOSIS — N390 Urinary tract infection, site not specified: Secondary | ICD-10-CM

## 2020-04-07 LAB — POCT URINALYSIS DIP (CLINITEK)
Bilirubin, UA: NEGATIVE
Glucose, UA: NEGATIVE mg/dL
Ketones, POC UA: NEGATIVE mg/dL
Nitrite, UA: NEGATIVE
POC PROTEIN,UA: NEGATIVE
Spec Grav, UA: 1.015 (ref 1.010–1.025)
Urobilinogen, UA: 0.2 E.U./dL
pH, UA: 6 (ref 5.0–8.0)

## 2020-04-07 MED ORDER — DICLOFENAC SODIUM 75 MG PO TBEC: 75.0000 mg | DELAYED_RELEASE_TABLET | Freq: Two times a day (BID) | ORAL | 0 refills | Status: DC

## 2020-04-07 MED ORDER — SULFAMETHOXAZOLE-TRIMETHOPRIM 800-160 MG PO TABS: 1.0000 | ORAL_TABLET | Freq: Two times a day (BID) | ORAL | 0 refills | Status: DC

## 2020-04-07 MED ORDER — METHOCARBAMOL 500 MG PO TABS: 500.0000 mg | ORAL_TABLET | Freq: Four times a day (QID) | ORAL | 0 refills | Status: DC

## 2020-04-07 NOTE — Progress Notes (Signed)
.  bw

## 2020-04-07 NOTE — Patient Instructions (Signed)
° ° ° °  If you have lab work done today you will be contacted with your lab results within the next 2 weeks.  If you have not heard from us then please contact us. The fastest way to get your results is to register for My Chart. ° ° °IF you received an x-ray today, you will receive an invoice from [facility]. Please contact [facility] at [phone] with questions or concerns regarding your invoice.  ° °IF you received labwork today, you will receive an invoice from [facility]. Please contact [facility] at [phone] with questions or concerns regarding your invoice.  ° °Our billing staff will not be able to assist you with questions regarding bills from these companies. ° °You will be contacted with the lab results as soon as they are available. The fastest way to get your results is to activate your My Chart account. Instructions are located on the last page of this paperwork. If you have not heard from us regarding the results in 2 weeks, please contact this office. °  ° ° ° °

## 2020-04-07 NOTE — Progress Notes (Signed)
Established Patient Office Visit  Subjective:  Patient ID: Kathleen Gutierrez, female    DOB: [birth year]  Age: 49 y.o. MRN: [id number]  CC:  Chief Complaint  Patient presents with  . Back Pain    Patient states she has been having lower back for about a week and she went to the ER left without being seen because she couldnt stand the pain and makes it hard ro walk. she states she has been taking tylenol and naproxen, She thinks this also started after getting the Covid vaccine     HPI Kathleen Gutierrez presents for lower back pain Onset around 1 week ago. Center and lower right back extending to flank. She has been exercising a lot lately - unsure if she strained something. Notes that she has not had urinary symptoms No fever, chills, fatigue. Denies abdominal pain - but does endorse some mild suprapubic discomfort.  She recently received the second of her COVID-19 vaccinations.   Past Medical History:  Diagnosis Date  . Headache     Past Surgical History:  Procedure Laterality Date  . NO PAST SURGERIES      Family History  Problem Relation Age of Onset  . Hypertension Mother   . Diabetes Father   . Pancreatic cancer Father   . Hypertension Sister   . Diabetes Sister   . Breast cancer Sister   . Hypertension Brother   . Diabetes Brother   . Diabetes Sister   . Diabetes Brother     Social History   Socioeconomic History  . Marital status: Married    Spouse name: Not on file  . Number of children: 3  . Years of education: Not on file  . Highest education level: 12th grade  Occupational History  . Occupation: unemployed  Tobacco Use  . Smoking status: Never Smoker  . Smokeless tobacco: Never Used  Substance and Sexual Activity  . Alcohol use: No  . Drug use: No  . Sexual activity: Yes  Other Topics Concern  . Not on file  Social History Narrative   Does not worked - stays at home with 25 year old daughter.      Patient is right-handed.  She lives with her boyfriend in a one level home. She drinks one cup of coffee a day. She does not exercise.   Social Determinants of Health   Financial Resource Strain:   . Difficulty of Paying Living Expenses:   Food Insecurity:   . Worried About Programme researcher, broadcasting/film/video in the Last Year:   . Barista in the Last Year:   Transportation Needs:   . Freight forwarder (Medical):   Marland Kitchen Lack of Transportation (Non-Medical):   Physical Activity:   . Days of Exercise per Week:   . Minutes of Exercise per Session:   Stress:   . Feeling of Stress :   Social Connections:   . Frequency of Communication with Friends and Family:   . Frequency of Social Gatherings with Friends and Family:   . Attends Religious Services:   . Active Member of Clubs or Organizations:   . Attends Banker Meetings:   Marland Kitchen Marital Status:   Intimate Partner Violence:   . Fear of Current or Ex-Partner:   . Emotionally Abused:   Marland Kitchen Physically Abused:   . Sexually Abused:     Outpatient Medications Prior to Visit  Medication Sig Dispense Refill  . ALPRAZolam (XANAX) 0.5 MG tablet Take 1  tablet (0.5 mg total) by mouth at bedtime as needed for anxiety. 20 tablet 0  . naproxen (NAPROSYN) 250 MG tablet Take 250 mg by mouth 2 (two) times daily as needed for mild pain.    Marland Kitchen propranolol (INDERAL) 20 MG tablet Take 1 tablet (20 mg total) by mouth 2 (two) times daily. 60 tablet 3  . zonisamide (ZONEGRAN) 25 MG capsule 25mg  daily for one week, then 50mg  daily for one week, then 75mg  daily for one week, then 100mg  daily. 270 capsule 0   No facility-administered medications prior to visit.    No Known Allergies  ROS Review of Systems Per hpi     Objective:    Physical Exam  Constitutional: She is oriented to person, place, and time. She appears well-developed and well-nourished. No distress.  Cardiovascular: Normal rate and regular rhythm.  Pulmonary/Chest: Effort normal. No respiratory distress.    Musculoskeletal:        General: Tenderness (lower right back, flank. Mild) present. No deformity or edema. Normal range of motion.  Neurological: She is alert and oriented to person, place, and time.  Skin: Skin is dry. No rash noted. She is not diaphoretic. No erythema. No pallor.  Psychiatric: She has a normal mood and affect. Her behavior is normal. Judgment and thought content normal.  Nursing note and vitals reviewed.   BP 117/79   Pulse 74   Temp 98.3 F (36.8 C) (Temporal)   Resp 16   Ht 5' (1.524 m)   Wt 173 lb 6.4 oz (78.7 kg)   SpO2 98%   BMI 33.86 kg/m  Wt Readings from Last 3 Encounters:  04/07/20 173 lb 6.4 oz (78.7 kg)  12/24/19 178 lb (80.7 kg)  06/14/19 170 lb (77.1 kg)     Health Maintenance Due  Topic Date Due  . COVID-19 Vaccine (1) Never done    There are no preventive care reminders to display for this patient.  Lab Results  Component Value Date   TSH 1.640 10/24/2017   Lab Results  Component Value Date   WBC 7.7 10/24/2017   HGB 11.6 (L) 03/22/2019   HCT 34.0 (L) 03/22/2019   MCV 87.9 10/24/2017   PLT 213 04/26/2015   Lab Results  Component Value Date   NA 142 03/22/2019   K 4.3 03/22/2019   CO2 21 10/24/2017   GLUCOSE 113 (H) 10/12/2018   BUN 13 10/12/2018   CREATININE 0.80 10/12/2018   BILITOT <0.2 06/28/2017   ALKPHOS 70 06/28/2017   AST 18 06/28/2017   ALT 15 06/28/2017   PROT 7.1 06/28/2017   ALBUMIN 4.4 06/28/2017   CALCIUM 9.3 10/24/2017   No results found for: CHOL No results found for: HDL No results found for: LDLCALC No results found for: TRIG No results found for: CHOLHDL Lab Results  Component Value Date   HGBA1C 6.1 10/24/2017      Assessment & Plan:   Problem List Items Addressed This Visit    None    Visit Diagnoses    Urinary tract infection without hematuria, site unspecified    -  Primary   Relevant Medications   sulfamethoxazole-trimethoprim (BACTRIM DS) 800-160 MG tablet   Back pain,  unspecified back location, unspecified back pain laterality, unspecified chronicity       Relevant Medications   diclofenac (VOLTAREN) 75 MG EC tablet   methocarbamol (ROBAXIN) 500 MG tablet   Other Relevant Orders   POCT URINALYSIS DIP (CLINITEK) (Completed)   Comprehensive metabolic panel  CBC with Differential      Meds ordered this encounter  Medications  . sulfamethoxazole-trimethoprim (BACTRIM DS) 800-160 MG tablet    Sig: Take 1 tablet by mouth 2 (two) times daily.    Dispense:  6 tablet    Refill:  0    Order Specific Question:   Supervising Provider    Answer:   Delia Chimes A [id number]  . diclofenac (VOLTAREN) 75 MG EC tablet    Sig: Take 1 tablet (75 mg total) by mouth 2 (two) times daily.    Dispense:  30 tablet    Refill:  0    Order Specific Question:   Supervising Provider    Answer:   Delia Chimes A [id number]  . methocarbamol (ROBAXIN) 500 MG tablet    Sig: Take 1 tablet (500 mg total) by mouth 4 (four) times daily.    Dispense:  60 tablet    Refill:  0    Order Specific Question:   Supervising Provider    Answer:   Forrest Moron [id number]    Follow-up: No follow-ups on file.   PLAN  poct UA shows leuks in urine - will tx for uti  May be msk element - diclofenac, methocarbamol, stretching, will consider PT referral  Return precautions given  Patient encouraged to call clinic with any questions, comments, or concerns.  Maximiano Coss, NP

## 2020-04-08 LAB — CBC WITH DIFFERENTIAL/PLATELET
Basophils Absolute: 0.1 10*3/uL (ref 0.0–0.2)
Basos: 1 %
EOS (ABSOLUTE): 0.1 10*3/uL (ref 0.0–0.4)
Eos: 2 %
Hematocrit: 42.8 % (ref 34.0–46.6)
Hemoglobin: 14.6 g/dL (ref 11.1–15.9)
Immature Grans (Abs): 0 10*3/uL (ref 0.0–0.1)
Immature Granulocytes: 0 %
Lymphocytes Absolute: 1.8 10*3/uL (ref 0.7–3.1)
Lymphs: 26 %
MCH: 31.5 pg (ref 26.6–33.0)
MCHC: 34.1 g/dL (ref 31.5–35.7)
MCV: 92 fL (ref 79–97)
Monocytes Absolute: 0.4 10*3/uL (ref 0.1–0.9)
Monocytes: 6 %
Neutrophils Absolute: 4.4 10*3/uL (ref 1.4–7.0)
Neutrophils: 65 %
Platelets: 265 10*3/uL (ref 150–450)
RBC: 4.63 x10E6/uL (ref 3.77–5.28)
RDW: 11.9 % (ref 11.7–15.4)
WBC: 6.9 10*3/uL (ref 3.4–10.8)

## 2020-04-08 LAB — COMPREHENSIVE METABOLIC PANEL
ALT: 40 IU/L — ABNORMAL HIGH (ref 0–32)
AST: 27 IU/L (ref 0–40)
Albumin/Globulin Ratio: 1.6 (ref 1.2–2.2)
Albumin: 4.4 g/dL (ref 3.8–4.8)
Alkaline Phosphatase: 72 IU/L (ref 39–117)
BUN/Creatinine Ratio: 16 (ref 9–23)
BUN: 11 mg/dL (ref 6–24)
Bilirubin Total: 0.3 mg/dL (ref 0.0–1.2)
CO2: 21 mmol/L (ref 20–29)
Calcium: 9.3 mg/dL (ref 8.7–10.2)
Chloride: 105 mmol/L (ref 96–106)
Creatinine, Ser: 0.68 mg/dL (ref 0.57–1.00)
GFR calc Af Amer: 121 mL/min/{1.73_m2} (ref >59)
GFR calc non Af Amer: 105 mL/min/{1.73_m2} (ref >59)
Globulin, Total: 2.7 g/dL (ref 1.5–4.5)
Glucose: 106 mg/dL — ABNORMAL HIGH (ref 65–99)
Potassium: 4.4 mmol/L (ref 3.5–5.2)
Sodium: 137 mmol/L (ref 134–144)
Total Protein: 7.1 g/dL (ref 6.0–8.5)

## 2020-04-10 NOTE — Progress Notes (Signed)
Good day,  Normal results letter, please!  Thank you,  Jari Sportsman, NP

## 2020-05-23 NOTE — Progress Notes (Deleted)
NEUROLOGY FOLLOW UP OFFICE NOTE  Kathleen Gutierrez [id number]  HISTORY OF PRESENT ILLNESS: Kathleen Gutierrez is a 49 year old right-handed woman who follows up for headaches.  UPDATE: MRI of brain with and without contrast on 01/17/2020 personally reviewed and showed minimal non-enhancing nonspecific punctate foci in the cerebral white matter as well as chronic left maxillary sinusitis but otherwise unremarkable.  Intensity:severe Duration:2 hours with naproxen Frequency:3 to 4 days a month Current NSAIDS:Naproxen (avoids spicy food, so no longer upsets stomach). Current analgesics:None Current triptans:None Current ergotamine:None Current anti-emetic:None Current muscle relaxants:None Current anti-anxiolytic: Xanax Current sleep aide:None Current Antihypertensive medications:propranolol 20mg  twice daily Current Antidepressant medications:None Current Anticonvulsant medications:none Current anti-CGRP:None Current Vitamins/Herbal/Supplements:magnesium Current Antihistamines/Decongestants:None Other therapy:None Hormone/birth control: None  Caffeine:1 to 2 cups of coffee daily Diet:Drinks a lot of water daily Exercise:Not routine Depression:no; Anxiety: improved Other pain: Over the past year, she endorsed bilateral knee pain and right hand pain. X-rays of right hand and knee from 04/27/18 were unremarkable. Sleep hygiene:Sleeps well but has vivid dreams.  HISTORY: Past history of headaches.She started having new onset headaches in September 2019.They area severesharp pain in the right occipital region and sometimes above right eye and into jaw. There is no preceding aura.They are associated with nausea, heart burn, photophobia, phonophobia, blurred vision,burning and pain in hands. There is no associated unilateral numbness or weakness.. It typically lasts all day (worse later in the day/night)and occurs 5 to 6  days a week.They are triggered by using the phone. It is relieved by rest.   Past NSAIDS:Meloxicam, ibuprofen, naproxen (GI upset) Past analgesics:tramadol, Excedrin, Fioricet Past abortive triptans:sumatriptan 100mg  (made her feel awful), rizatriptan (expensive) Past abortive ergotamine:None Past muscle relaxants:None Past anti-emetic:None Past antihypertensive medications:None Past antidepressant medications:amitriptyline 10mg  (nightmares) Past anticonvulsant medications:topiramate 25mg  (took for only a couple of nights because it caused difficulty falling asleep); zonisamide 25mg  (for one week, caused memory deficits and made her feel weak) Past anti-CGRP:None Past vitamins/Herbal/Supplements:None Past antihistamines/decongestants:none Other past therapies:chiropractic medication, acupuncture  CTA of the head performed on 12/04/2018 was unremarkable with no evidence of aneurysm. 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  PAST MEDICAL HISTORY: Past Medical History:  Diagnosis Date  . Headache     MEDICATIONS: Current Outpatient Medications on File Prior to Visit  Medication Sig Dispense Refill  . ALPRAZolam (XANAX) 0.5 MG tablet Take 1 tablet (0.5 mg total) by mouth at bedtime as needed for anxiety. 20 tablet 0  . diclofenac (VOLTAREN) 75 MG EC tablet Take 1 tablet (75 mg total) by mouth 2 (two) times daily. 30 tablet 0  . methocarbamol (ROBAXIN) 500 MG tablet Take 1 tablet (500 mg total) by mouth 4 (four) times daily. 60 tablet 0  . naproxen (NAPROSYN) 250 MG tablet Take 250 mg by mouth 2 (two) times daily as needed for mild pain.    Marland Kitchen propranolol (INDERAL) 20 MG tablet Take 1 tablet (20 mg total) by mouth 2 (two) times daily. 60 tablet 3  . sulfamethoxazole-trimethoprim (BACTRIM DS) 800-160 MG tablet Take 1 tablet by mouth 2 (two) times daily. 6 tablet 0  . zonisamide (ZONEGRAN) 25 MG capsule 25mg  daily for one  week, then 50mg  daily for one week, then 75mg  daily for one week, then 100mg  daily. 270 capsule 0   No current facility-administered medications on file prior to visit.    ALLERGIES: No Known Allergies  FAMILY HISTORY: Family History  Problem Relation Age of Onset  . Hypertension Mother   .  Diabetes Father   . Pancreatic cancer Father   . Hypertension Sister   . Diabetes Sister   . Breast cancer Sister   . Hypertension Brother   . Diabetes Brother   . Diabetes Sister   . Diabetes Brother    ***.  SOCIAL HISTORY: Social History   Socioeconomic History  . Marital status: Married    Spouse name: Not on file  . Number of children: 3  . Years of education: Not on file  . Highest education level: 12th grade  Occupational History  . Occupation: unemployed  Tobacco Use  . Smoking status: Never Smoker  . Smokeless tobacco: Never Used  Vaping Use  . Vaping Use: Never used  Substance and Sexual Activity  . Alcohol use: No  . Drug use: No  . Sexual activity: Yes  Other Topics Concern  . Not on file  Social History Narrative   Does not worked - stays at home with 89 year old daughter.      Patient is right-handed. She lives with her boyfriend in a one level home. She drinks one cup of coffee a day. She does not exercise.   Social Determinants of Health   Financial Resource Strain:   . Difficulty of Paying Living Expenses:   Food Insecurity:   . Worried About Programme researcher, broadcasting/film/video in the Last Year:   . Barista in the Last Year:   Transportation Needs:   . Freight forwarder (Medical):   Marland Kitchen Lack of Transportation (Non-Medical):   Physical Activity:   . Days of Exercise per Week:   . Minutes of Exercise per Session:   Stress:   . Feeling of Stress :   Social Connections:   . Frequency of Communication with Friends and Family:   . Frequency of Social Gatherings with Friends and Family:   . Attends Religious Services:   . Active Member of Clubs or  Organizations:   . Attends Banker Meetings:   Marland Kitchen Marital Status:   Intimate Partner Violence:   . Fear of Current or Ex-Partner:   . Emotionally Abused:   Marland Kitchen Physically Abused:   . Sexually Abused:     PHYSICAL EXAM: *** General: No acute distress.  Patient appears well-groomed.   Head:  Normocephalic/atraumatic Eyes:  Fundi examined but not visualized Neck: supple, no paraspinal tenderness, full range of motion Heart:  Regular rate and rhythm Lungs:  Clear to auscultation bilaterally Back: No paraspinal tenderness Neurological Exam: alert and oriented to person, place, and time. Attention span and concentration intact, recent and remote memory intact, fund of knowledge intact.  Speech fluent and not dysarthric, language intact.  CN II-XII intact. Bulk and tone normal, muscle strength 5/5 throughout.  Sensation to light touch, temperature and vibration intact.  Deep tendon reflexes 2+ throughout, toes downgoing.  Finger to nose and heel to shin testing intact.  Gait normal, Romberg negative.  IMPRESSION: Migraine without aura, without status migrainosus, not intractable  PLAN: 1.  For preventative management, *** 2.  For abortive therapy, *** 3.  Limit use of pain relievers to no more than 2 days out of week to prevent risk of rebound or medication-overuse headache. 4.  Keep headache diary 5.  Exercise, hydration, caffeine cessation, sleep hygiene, monitor for and avoid triggers 6. Follow up ***   Shon Millet, DO

## 2020-05-26 ENCOUNTER — Ambulatory Visit: Payer: Self-pay | Admitting: Neurology

## 2020-08-24 ENCOUNTER — Ambulatory Visit: Payer: Self-pay

## 2020-08-24 ENCOUNTER — Other Ambulatory Visit: Payer: Self-pay

## 2020-09-02 ENCOUNTER — Other Ambulatory Visit: Payer: Self-pay | Admitting: Neurology

## 2020-09-03 ENCOUNTER — Other Ambulatory Visit: Payer: Self-pay

## 2020-09-03 ENCOUNTER — Ambulatory Visit (INDEPENDENT_AMBULATORY_CARE_PROVIDER_SITE_OTHER): Payer: Self-pay | Admitting: Neurology

## 2020-09-03 ENCOUNTER — Encounter: Payer: Self-pay | Admitting: Neurology

## 2020-09-03 VITALS — BP 119/78 | HR 86 | Resp 18 | Ht 60.0 in | Wt 152.0 lb

## 2020-09-03 DIAGNOSIS — G43009 Migraine without aura, not intractable, without status migrainosus: Secondary | ICD-10-CM

## 2020-09-03 MED ORDER — PROPRANOLOL HCL 20 MG PO TABS: 20.0000 mg | ORAL_TABLET | Freq: Two times a day (BID) | ORAL | 11 refills | Status: DC

## 2020-09-03 NOTE — Progress Notes (Signed)
NEUROLOGY FOLLOW UP OFFICE NOTE  Kathleen Gutierrez [id number]  HISTORY OF PRESENT ILLNESS: Kathleen Gutierrez is a 49 year old right-handed woman who follows up for headaches.  UPDATE: MRI of brain with and without contrast on 01/17/2020 personally reviewed showed minimal scattered punctate hyperintense foci in the cerebral white matter but overall unremarkable.  She started acupuncture which has helped her headaches.  She is also getting a divorce which has been good for her stress level.   No headaches in past 30 days. Current NSAIDS:none Current analgesics:None Current triptans:None Current ergotamine:None Current anti-emetic:None Current muscle relaxants:None Current anti-anxiolytic: none Current sleep aide:None Current Antihypertensive medications:propranolol 20mg  twice daily Current Antidepressant medications:None Current Anticonvulsant medications:none Current anti-CGRP:None Current Vitamins/Herbal/Supplements:magnesium Current Antihistamines/Decongestants:None Other therapy:None Hormone/birth control: None  Caffeine:1 to 2 cups of coffee daily Diet:Drinks a lot of water daily Exercise:Not routine Depression:no; Anxiety: improved   HISTORY: Past history of headaches.She started having new onset headaches in September 2019.They area severesharp pain in the right occipital region and sometimes above right eye and into jaw. There is no preceding aura.They are associated with nausea, heart burn, photophobia, phonophobia, blurred vision,burning and pain in hands. There is no associated unilateral numbness or weakness.. It typically lasts all day (worse later in the day/night)and occurs 5 to 6 days a week.They are triggered by using the phone. It is relieved by rest.   Past NSAIDS:Meloxicam, ibuprofen, naproxen (GI upset) Past analgesics:tramadol, Excedrin, Fioricet Past abortive triptans:sumatriptan 100mg   (made her feel awful), rizatriptan (expensive) Past abortive ergotamine:None Past muscle relaxants:None Past anti-emetic:None Past antihypertensive medications:None Past antidepressant medications:amitriptyline 10mg  (nightmares) Past anticonvulsant medications:topiramate 25mg  (took for only a couple of nights because it caused difficulty falling asleep); zonisamide 25mg  (for one week, caused memory deficits and made her feel weak) Past anti-CGRP:None Past vitamins/Herbal/Supplements:None Past antihistamines/decongestants:none Other past therapies:chiropractic medication, acupuncture  CTA of the head performed on 12/04/2018 was unremarkable with no evidence of aneurysm. 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  PAST MEDICAL HISTORY: Past Medical History:  Diagnosis Date   Headache     MEDICATIONS: Current Outpatient Medications on File Prior to Visit  Medication Sig Dispense Refill   ALPRAZolam (XANAX) 0.5 MG tablet Take 1 tablet (0.5 mg total) by mouth at bedtime as needed for anxiety. 20 tablet 0   diclofenac (VOLTAREN) 75 MG EC tablet Take 1 tablet (75 mg total) by mouth 2 (two) times daily. 30 tablet 0   methocarbamol (ROBAXIN) 500 MG tablet Take 1 tablet (500 mg total) by mouth 4 (four) times daily. 60 tablet 0   naproxen (NAPROSYN) 250 MG tablet Take 250 mg by mouth 2 (two) times daily as needed for mild pain.     propranolol (INDERAL) 20 MG tablet Take 1 tablet (20 mg total) by mouth 2 (two) times daily. 60 tablet 3   sulfamethoxazole-trimethoprim (BACTRIM DS) 800-160 MG tablet Take 1 tablet by mouth 2 (two) times daily. 6 tablet 0   zonisamide (ZONEGRAN) 25 MG capsule 25mg  daily for one week, then 50mg  daily for one week, then 75mg  daily for one week, then 100mg  daily. 270 capsule 0   No current facility-administered medications on file prior to visit.    ALLERGIES: No Known Allergies  FAMILY HISTORY: Family  History  Problem Relation Age of Onset   Hypertension Mother    Diabetes Father    Pancreatic cancer Father    Hypertension Sister    Diabetes Sister    Breast cancer Sister  Hypertension Brother    Diabetes Brother    Diabetes Sister    Diabetes Brother     SOCIAL HISTORY: Social History   Socioeconomic History   Marital status: Married    Spouse name: Not on file   Number of children: 3   Years of education: Not on file   Highest education level: 12th grade  Occupational History   Occupation: unemployed  Tobacco Use   Smoking status: Never Smoker   Smokeless tobacco: Never Used  Building services engineer Use: Never used  Substance and Sexual Activity   Alcohol use: No   Drug use: No   Sexual activity: Yes  Other Topics Concern   Not on file  Social History Narrative   Does not worked - stays at home with 34 year old daughter.      Patient is right-handed. She lives with her boyfriend in a one level home. She drinks one cup of coffee a day. She does not exercise.   Social Determinants of Health   Financial Resource Strain:    Difficulty of Paying Living Expenses: Not on file  Food Insecurity:    Worried About Programme researcher, broadcasting/film/video in the Last Year: Not on file   [employer] of Food in the Last Year: Not on file  Transportation Needs:    Lack of Transportation (Medical): Not on file   Lack of Transportation (Non-Medical): Not on file  Physical Activity:    Days of Exercise per Week: Not on file   Minutes of Exercise per Session: Not on file  Stress:    Feeling of Stress : Not on file  Social Connections:    Frequency of Communication with Friends and Family: Not on file   Frequency of Social Gatherings with Friends and Family: Not on file   Attends Religious Services: Not on file   Active Member of Clubs or Organizations: Not on file   Attends Banker Meetings: Not on file   Marital Status: Not on file  Intimate  Partner Violence:    Fear of Current or Ex-Partner: Not on file   Emotionally Abused: Not on file   Physically Abused: Not on file   Sexually Abused: Not on file   PHYSICAL EXAM: Blood pressure 119/78, pulse 86, resp. rate 18, height 5' (1.524 m), weight 152 lb (68.9 kg), SpO2 (!) 8 %. General: No acute distress.  Patient appears well-groomed.   Head:  Normocephalic/atraumatic Eyes:  Fundi examined but not visualized Neck: supple, no paraspinal tenderness, full range of motion Heart:  Regular rate and rhythm Lungs:  Clear to auscultation bilaterally Back: No paraspinal tenderness Neurological Exam: alert and oriented to person, place, and time. Attention span and concentration intact, recent and remote memory intact, fund of knowledge intact.  Speech fluent and not dysarthric, language intact.  CN II-XII intact. Bulk and tone normal, muscle strength 5/5 throughout.  Sensation to light touch, temperature and vibration intact.  Deep tendon reflexes 2+ throughout, toes downgoing.  Finger to nose and heel to shin testing intact.  Gait normal, Romberg negative.  IMPRESSION: Migraine without aura, without status migrainosus, not intractable  PLAN: 1.  For preventative management, propranolol 20mg  twice daily 2. Limit use of pain relievers to no more than 2 days out of week to prevent risk of rebound or medication-overuse headache. 3.  Keep headache diary 4.  Exercise, hydration, caffeine cessation, sleep hygiene, monitor for and avoid triggers 5. Follow up one year  Shon Millet, DO

## 2020-09-03 NOTE — Patient Instructions (Signed)
Refilled propranolol Follow up in one year

## 2020-10-11 IMAGING — CT CT ANGIO HEAD
3 of 11 series · 15 of 47 positions shown · IV contrast (iopamidol)
Comparison: None.

CLINICAL DATA: Right occipital headaches since [REDACTED]. Nausea
and blurred vision with bilateral hand weakness.

EXAM:
CT ANGIOGRAPHY HEAD
TECHNIQUE: Multidetector CT imaging of the head was performed using the
standard protocol during bolus administration of intravenous
contrast. Multiplanar CT image reconstructions and MIPs were
obtained to evaluate the vascular anatomy.
CONTRAST:  75mL N2PCM6-Z8V IOPAMIDOL (N2PCM6-Z8V) INJECTION 76%

[Series 16: cta brain 1.00 hv48 ax thin mips · axial · 0.45mm/px · z∈[-610,-471]mm · 9 of 175 slices shown]
[im 18/175  brain]
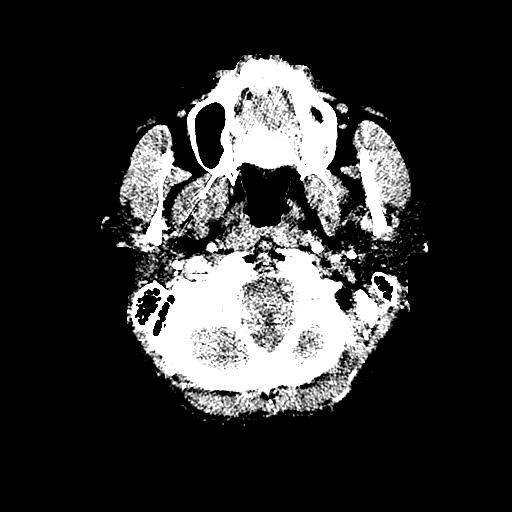
[im 35/175  bone]
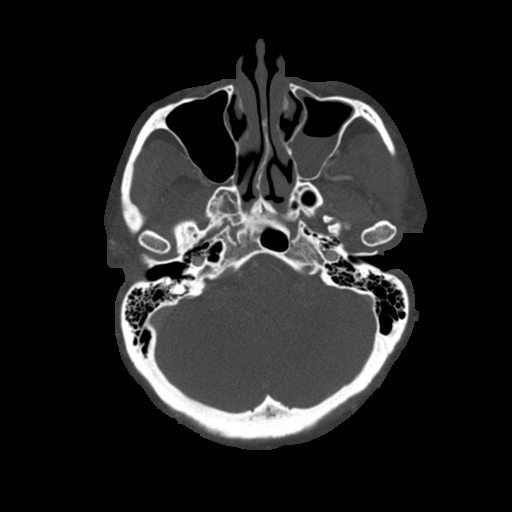
[im 53/175  brain]
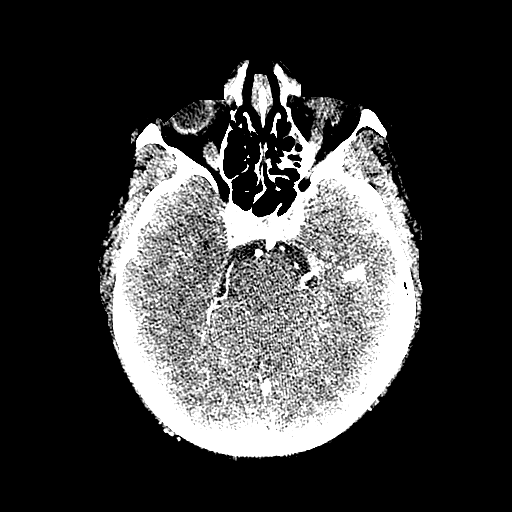
[im 70/175  bone]
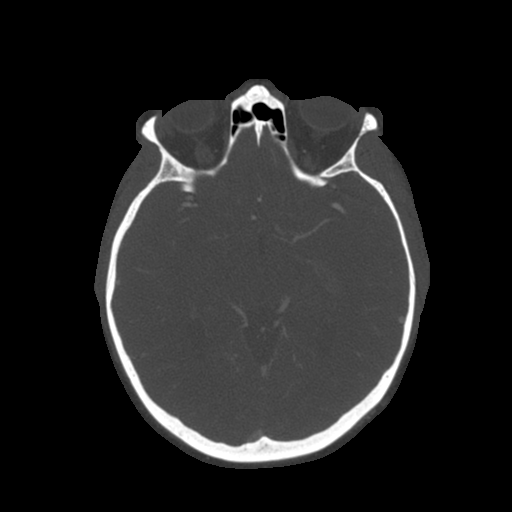
[im 88/175  brain]
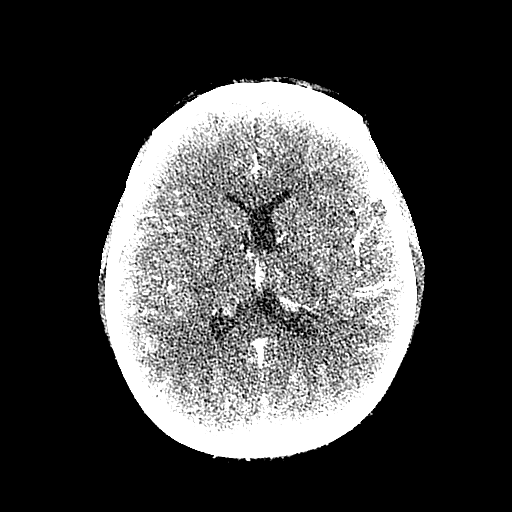
[im 105/175  bone]
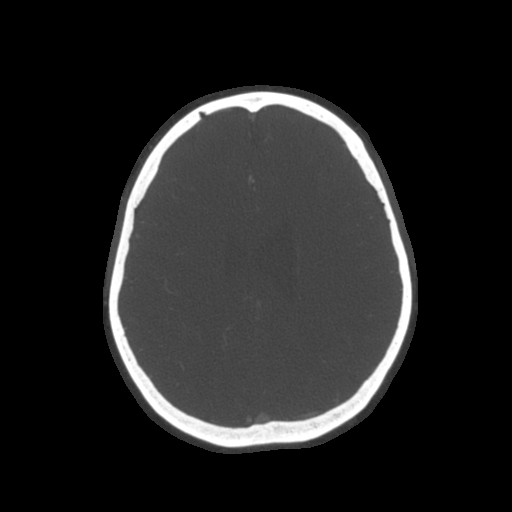
[im 122/175  brain]
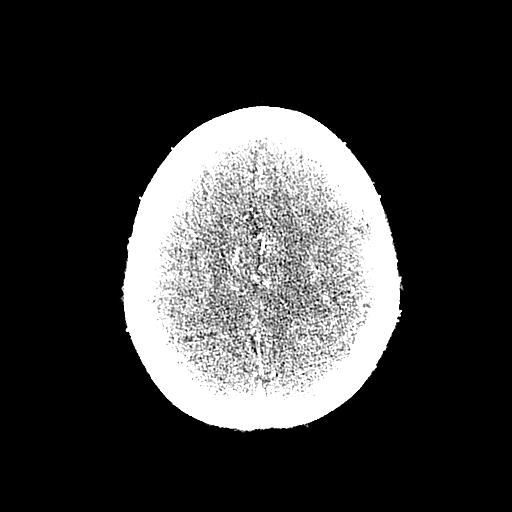
[im 140/175  bone]
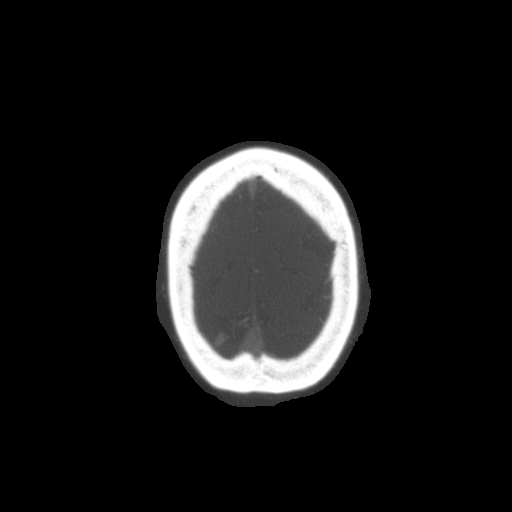
[im 157/175  brain]
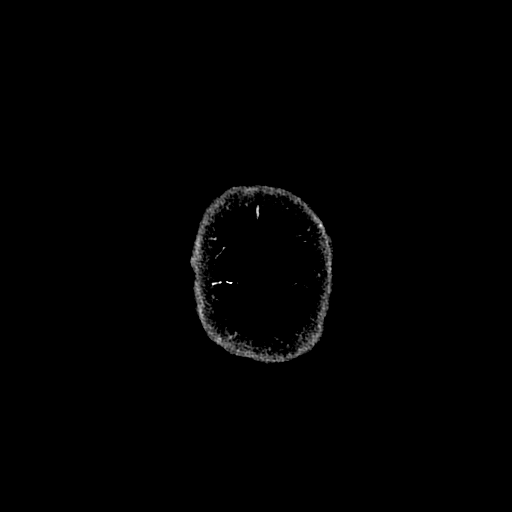

[Series 17: cta brain 1.00 hv48 cor cor thin mips · coronal · 0.33mm/px · 3 of 225 slices shown]
[im 65/225  brain]
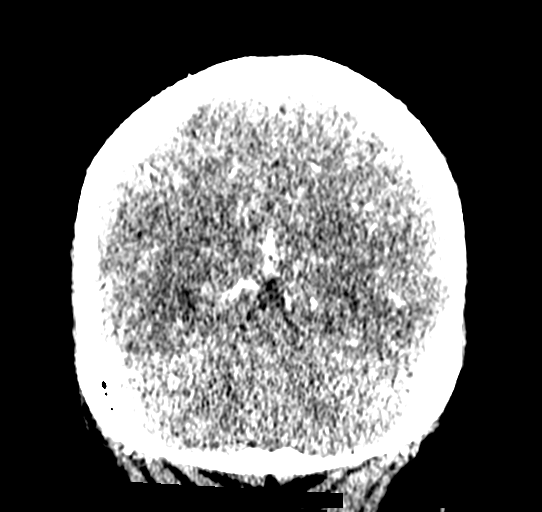
[im 97/225  brain]
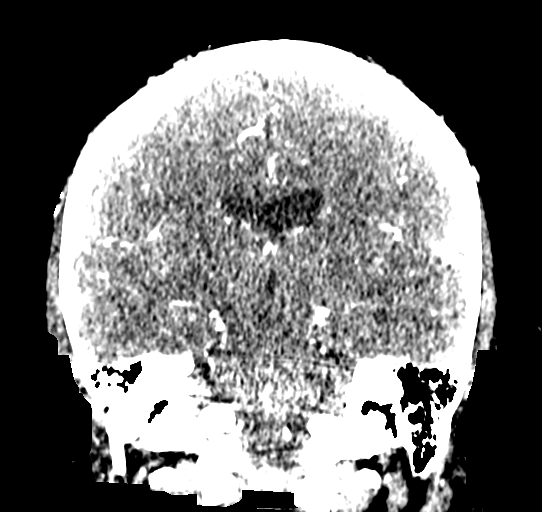
[im 129/225  brain]
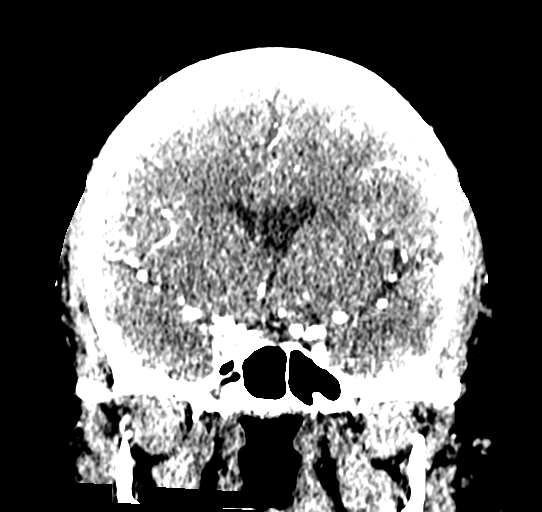

[Series 19: cta brain 1.00 hv48 sag sag thin mips · sagittal · 0.33mm/px · 3 of 180 slices shown]
[im 45/180  brain]
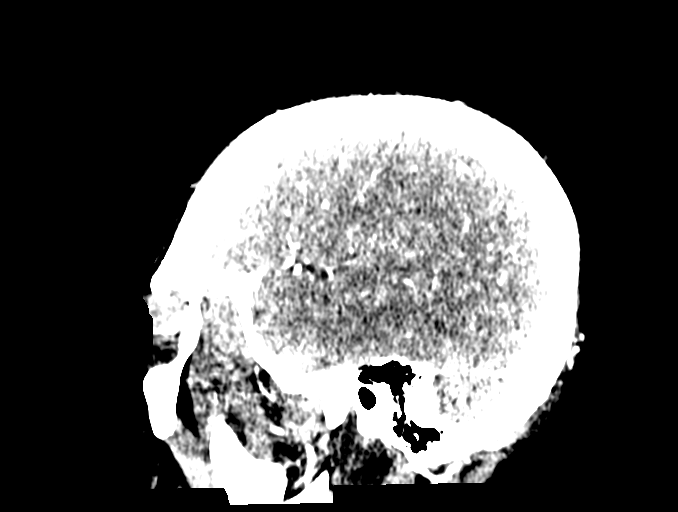
[im 90/180  brain]
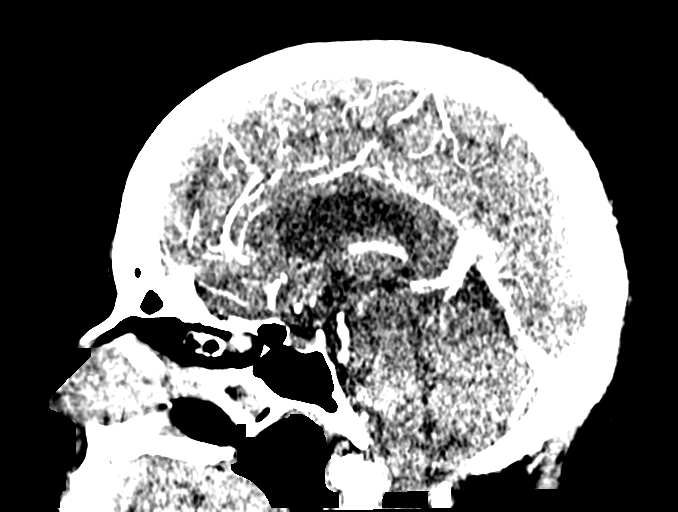
[im 135/180  brain]
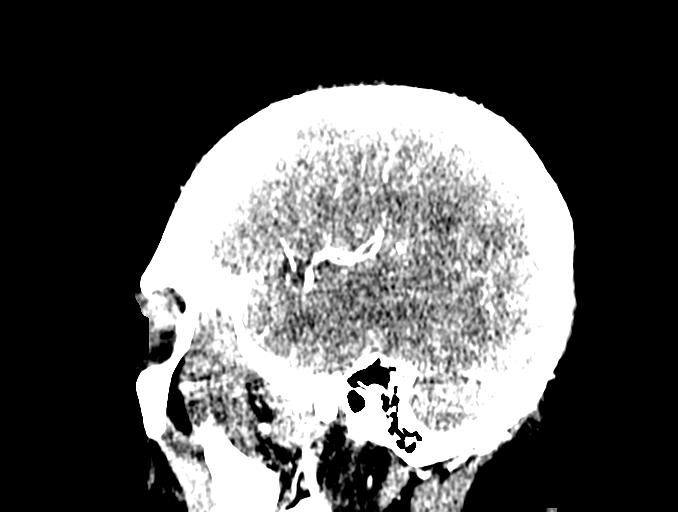

[15 of 47 positions shown; findings below may reference images not displayed]

FINDINGS: CT HEAD

Brain: The brain shows a normal appearance without evidence of
malformation, atrophy, old or acute small or large vessel
infarction, mass lesion, hemorrhage, hydrocephalus or extra-axial
collection. Mild asymmetry of the lateral ventricles is a normal
variant.

Vascular: No hyperdense vessel. No evidence of atherosclerotic
calcification.

Skull: Normal. No traumatic finding. No focal bone lesion.

Sinuses/Orbits: Sinuses are clear. Orbits appear normal. Mastoids
are clear.

Other: None significant

CTA HEAD

Anterior circulation: Both internal carotid arteries widely patent
through the skull base and siphon regions. There is a persistent
trigeminal artery on the left. The anterior and middle cerebral
vessels are normal without stenosis, aneurysm or vascular
malformation.

Posterior circulation: The vertebral arteries are diminutive. Both
are patent through the foramen magnum. The right terminates in PICA.
The left supplies a diminutive basilar artery. Persistent trigeminal
artery on the left enters the distal basilar and there is normal
appearing flow fact [place] the superior cerebellar and posterior
cerebral arteries. There is no evidence of aneurysm associated with
this patent trigeminal artery.

Venous sinuses: Patent and normal.

Anatomic variants: See above.

Delayed phase: No abnormal enhancement.
IMPRESSION: 1. Normal head CT.
2. No evidence of aneurysm. The patient has the congenital variation
of a persistent trigeminal artery on the left supplying the majority
of the flow to the distal basilar artery and posterior cerebral
arteries. There is no evidence of associated aneurysm. In the
absence of aneurysm, I am not aware of this being associated with
headache.

## 2021-01-21 ENCOUNTER — Ambulatory Visit (INDEPENDENT_AMBULATORY_CARE_PROVIDER_SITE_OTHER): Payer: Self-pay | Admitting: Family Medicine

## 2021-01-21 ENCOUNTER — Other Ambulatory Visit: Payer: Self-pay

## 2021-01-21 ENCOUNTER — Encounter: Payer: Self-pay | Admitting: Family Medicine

## 2021-01-21 ENCOUNTER — Ambulatory Visit: Payer: Self-pay | Admitting: *Deleted

## 2021-01-21 DIAGNOSIS — M549 Dorsalgia, unspecified: Secondary | ICD-10-CM

## 2021-01-21 MED ORDER — METHOCARBAMOL 500 MG PO TABS: 500.0000 mg | ORAL_TABLET | Freq: Every evening | ORAL | 0 refills | Status: DC | PRN

## 2021-01-21 MED ORDER — DICLOFENAC SODIUM 75 MG PO TBEC: 75.0000 mg | DELAYED_RELEASE_TABLET | Freq: Two times a day (BID) | ORAL | 0 refills | Status: DC

## 2021-01-21 MED ORDER — METHOCARBAMOL 500 MG PO TABS: 500.0000 mg | ORAL_TABLET | Freq: Four times a day (QID) | ORAL | 0 refills | Status: DC

## 2021-01-21 NOTE — Telephone Encounter (Signed)
Error

## 2021-01-21 NOTE — Patient Instructions (Addendum)
Low Back Sprain or Strain Rehab Ask your health care provider which exercises are safe for you. Do exercises exactly as told by your health care provider and adjust them as directed. It is normal to feel mild stretching, pulling, tightness, or discomfort as you do these exercises. Stop right away if you feel sudden pain or your pain gets worse. Do not begin these exercises until told by your health care provider. Stretching and range-of-motion exercises These exercises warm up your muscles and joints and improve the movement and flexibility of your back. These exercises also help to relieve pain, numbness, and tingling. Lumbar rotation 1. Lie on your back on a firm surface and bend your knees. 2. Straighten your arms out to your sides so each arm forms a 90-degree angle (right angle) with a side of your body. 3. Slowly move (rotate) both of your knees to one side of your body until you feel a stretch in your lower back (lumbar). Try not to let your shoulders lift off the floor. 4. Hold this position for __________ seconds. 5. Tense your abdominal muscles and slowly move your knees back to the starting position. 6. Repeat this exercise on the other side of your body. Repeat __________ times. Complete this exercise __________ times a day.   Single knee to chest 1. Lie on your back on a firm surface with both legs straight. 2. Bend one of your knees. Use your hands to move your knee up toward your chest until you feel a gentle stretch in your lower back and buttock. ? Hold your leg in this position by holding on to the front of your knee. ? Keep your other leg as straight as possible. 3. Hold this position for __________ seconds. 4. Slowly return to the starting position. 5. Repeat with your other leg. Repeat __________ times. Complete this exercise __________ times a day.   Prone extension on elbows 1. Lie on your abdomen on a firm surface (prone position). 2. Prop yourself up on your  elbows. 3. Use your arms to help lift your chest up until you feel a gentle stretch in your abdomen and your lower back. ? This will place some of your body weight on your elbows. If this is uncomfortable, try stacking pillows under your chest. ? Your hips should stay down, against the surface that you are lying on. Keep your hip and back muscles relaxed. 4. Hold this position for __________ seconds. 5. Slowly relax your upper body and return to the starting position. Repeat __________ times. Complete this exercise __________ times a day.   Strengthening exercises These exercises build strength and endurance in your back. Endurance is the ability to use your muscles for a long time, even after they get tired. Pelvic tilt This exercise strengthens the muscles that lie deep in the abdomen. 1. Lie on your back on a firm surface. Bend your knees and keep your feet flat on the floor. 2. Tense your abdominal muscles. Tip your pelvis up toward the ceiling and flatten your lower back into the floor. ? To help with this exercise, you may place a small towel under your lower back and try to push your back into the towel. 3. Hold this position for __________ seconds. 4. Let your muscles relax completely before you repeat this exercise. Repeat __________ times. Complete this exercise __________ times a day. Alternating arm and leg raises 1. Get on your hands and knees on a firm surface. If you are on  a hard floor, you may want to use padding, such as an exercise mat, to cushion your knees. 2. Line up your arms and legs. Your hands should be directly below your shoulders, and your knees should be directly below your hips. 3. Lift your left leg behind you. At the same time, raise your right arm and straighten it in front of you. ? Do not lift your leg higher than your hip. ? Do not lift your arm higher than your shoulder. ? Keep your abdominal and back muscles tight. ? Keep your hips facing the  ground. ? Do not arch your back. ? Keep your balance carefully, and do not hold your breath. 4. Hold this position for __________ seconds. 5. Slowly return to the starting position. 6. Repeat with your right leg and your left arm. Repeat __________ times. Complete this exercise __________ times a day.   Abdominal set with straight leg raise 1. Lie on your back on a firm surface. 2. Bend one of your knees and keep your other leg straight. 3. Tense your abdominal muscles and lift your straight leg up, 4-6 inches (10-15 cm) off the ground. 4. Keep your abdominal muscles tight and hold this position for __________ seconds. ? Do not hold your breath. ? Do not arch your back. Keep it flat against the ground. 5. Keep your abdominal muscles tense as you slowly lower your leg back to the starting position. 6. Repeat with your other leg. Repeat __________ times. Complete this exercise __________ times a day.   Single leg lower with bent knees 1. Lie on your back on a firm surface. 2. Tense your abdominal muscles and lift your feet off the floor, one foot at a time, so your knees and hips are bent in 90-degree angles (right angles). ? Your knees should be over your hips and your lower legs should be parallel to the floor. 3. Keeping your abdominal muscles tense and your knee bent, slowly lower one of your legs so your toe touches the ground. 4. Lift your leg back up to return to the starting position. ? Do not hold your breath. ? Do not let your back arch. Keep your back flat against the ground. 5. Repeat with your other leg. Repeat __________ times. Complete this exercise __________ times a day. Posture and body mechanics Good posture and healthy body mechanics can help to relieve stress in your body's tissues and joints. Body mechanics refers to the movements and positions of your body while you do your daily activities. Posture is part of body mechanics. Good posture means:  Your spine is in  its natural S-curve position (neutral).  Your shoulders are pulled back slightly.  Your head is not tipped forward. Follow these guidelines to improve your posture and body mechanics in your everyday activities. Standing  When standing, keep your spine neutral and your feet about hip width apart. Keep a slight bend in your knees. Your ears, shoulders, and hips should line up.  When you do a task in which you stand in one place for a long time, place one foot up on a stable object that is 2-4 inches (5-10 cm) high, such as a footstool. This helps keep your spine neutral.   Sitting  When sitting, keep your spine neutral and keep your feet flat on the floor. Use a footrest, if necessary, and keep your thighs parallel to the floor. Avoid rounding your shoulders, and avoid tilting your head forward.  When working at a  desk or a computer, keep your desk at a height where your hands are slightly lower than your elbows. Slide your chair under your desk so you are close enough to maintain good posture.  When working at a computer, place your monitor at a height where you are looking straight ahead and you do not have to tilt your head forward or downward to look at the screen.   Resting  When lying down and resting, avoid positions that are most painful for you.  If you have pain with activities such as sitting, bending, stooping, or squatting, lie in a position in which your body does not bend very much. For example, avoid curling up on your side with your arms and knees near your chest (fetal position).  If you have pain with activities such as standing for a long time or reaching with your arms, lie with your spine in a neutral position and bend your knees slightly. Try the following positions: ? Lying on your side with a pillow between your knees. ? Lying on your back with a pillow under your knees. Lifting  When lifting objects, keep your feet at least shoulder width apart and tighten your  abdominal muscles.  Bend your knees and hips and keep your spine neutral. It is important to lift using the strength of your legs, not your back. Do not lock your knees straight out.  Always ask for help to lift heavy or awkward objects.   This information is not intended to replace advice given to you by your health care provider. Make sure you discuss any questions you have with your health care provider. Document Revised: 03/23/2019 Document Reviewed: 12/21/2018 Elsevier Patient Education  2021 [employer]  Acute Back Pain, Adult Acute back pain is sudden and usually short-lived. It is often caused by an injury to the muscles and tissues in the back. The injury may result from:  A muscle or ligament getting overstretched or torn (strained). Ligaments are tissues that connect bones to each other. Lifting something improperly can cause a back strain.  Wear and tear (degeneration) of the spinal disks. Spinal disks are circular tissue that provide cushioning between the bones of the spine (vertebrae).  Twisting motions, such as while playing sports or doing yard work.  A hit to the back.  Arthritis. You may have a physical exam, lab tests, and imaging tests to find the cause of your pain. Acute back pain usually goes away with rest and home care. Follow these instructions at home: Managing pain, stiffness, and swelling  Treatment may include medicines for pain and inflammation that are taken by mouth or applied to the skin, prescription pain medicine, or muscle relaxants. Take over-the-counter and prescription medicines only as told by your health care provider.  Your health care provider may recommend applying ice during the first 24-48 hours after your pain starts. To do this: ? Put ice in a plastic bag. ? Place a towel between your skin and the bag. ? Leave the ice on for 20 minutes, 2-3 times a day.  If directed, apply heat to the affected area as often as told by your health  care provider. Use the heat source that your health care provider recommends, such as a moist heat pack or a heating pad. ? Place a towel between your skin and the heat source. ? Leave the heat on for 20-30 minutes. ? Remove the heat if your skin turns bright red. This is especially important if you  are unable to feel pain, heat, or cold. You have a greater risk of getting burned. Activity  Do not stay in bed. Staying in bed for more than 1-2 days can delay your recovery.  Sit up and stand up straight. Avoid leaning forward when you sit or hunching over when you stand. ? If you work at a desk, sit close to it so you do not need to lean over. Keep your chin tucked in. Keep your neck drawn back, and keep your elbows bent at a 90-degree angle (right angle). ? Sit high and close to the steering wheel when you drive. Add lower back (lumbar) support to your car seat, if needed.  Take short walks on even surfaces as soon as you are able. Try to increase the length of time you walk each day.  Do not sit, drive, or stand in one place for more than 30 minutes at a time. Sitting or standing for long periods of time can put stress on your back.  Do not drive or use heavy machinery while taking prescription pain medicine.  Use proper lifting techniques. When you bend and lift, use positions that put less stress on your back: ? [place] your knees. ? Keep the load close to your body. ? Avoid twisting.  Exercise regularly as told by your health care provider. Exercising helps your back heal faster and helps prevent back injuries by keeping muscles strong and flexible.  Work with a physical therapist to make a safe exercise program, as recommended by your health care provider. Do any exercises as told by your physical therapist.   Lifestyle  Maintain a healthy weight. Extra weight puts stress on your back and makes it difficult to have good posture.  Avoid activities or situations that make you feel  anxious or stressed. Stress and anxiety increase muscle tension and can make back pain worse. Learn ways to manage anxiety and stress, such as through exercise. General instructions  Sleep on a firm mattress in a comfortable position. Try lying on your side with your knees slightly bent. If you lie on your back, put a pillow under your knees.  Follow your treatment plan as told by your health care provider. This may include: ? Cognitive or behavioral therapy. ? Acupuncture or massage therapy. ? Meditation or yoga. Contact a health care provider if:  You have pain that is not relieved with rest or medicine.  You have increasing pain going down into your legs or buttocks.  Your pain does not improve after 2 weeks.  You have pain at night.  You lose weight without trying.  You have a fever or chills. Get help right away if:  You develop new bowel or bladder control problems.  You have unusual weakness or numbness in your arms or legs.  You develop nausea or vomiting.  You develop abdominal pain.  You feel faint. Summary  Acute back pain is sudden and usually short-lived.  Use proper lifting techniques. When you bend and lift, use positions that put less stress on your back.  Take over-the-counter and prescription medicines and apply heat or ice as directed by your health care provider. This information is not intended to replace advice given to you by your health care provider. Make sure you discuss any questions you have with your health care provider. Document Revised: 08/22/2020 Document Reviewed: 08/22/2020 Elsevier Patient Education  2021 [employer].   If you have lab work done today you will be contacted with your  lab results within the next 2 weeks.  If you have not heard from [place] then please contact us. The fastest way to get your results is to register for My Chart.   IF you received an x-ray today, you will receive an invoice from [facility]. Please  contact [facility] at [phone] with questions or concerns regarding your invoice.   IF you received labwork today, you will receive an invoice from [place]. Please contact [facility] at [phone] with questions or concerns regarding your invoice.   Our billing staff will not be able to assist you with questions regarding bills from these companies.  You will be contacted with the lab results as soon as they are available. The fastest way to get your results is to activate your My Chart account. Instructions are located on the last page of this paperwork. If you have not heard from [place] regarding the results in 2 weeks, please contact this office.

## 2021-01-21 NOTE — Progress Notes (Signed)
2/9/20224:00 PM  Kathleen Gutierrez Nov 03, 1971, 50 y.o., female [id number]  Chief Complaint  Patient presents with  . fall 2 days ago     Larey Seat onto buttocks is Painful in mid back area- taking tylenol, ibuprofen, aleve- worsens at work - requests something for inflammation     HPI:   Patient is a 50 y.o. female with past medical history significant for headaches and back pain who presents today for recent fall  2 days ago fell on the ice Denies LOC, hitting her head or headaches, dizziness Denies bruises, since then has noticed increased lower back soreness No decrease in ROM, and no change in gait Taking tylenol, ibuprofen and aleve for the pain  Pain has been stable  Depression screen [facility] 2/9 01/21/2021 04/07/2020 11/01/2018  Decreased Interest 0 0 0  Down, Depressed, Hopeless 0 0 0  PHQ - 2 Score 0 0 0    Fall Risk  01/21/2021 09/03/2020 04/07/2020 06/14/2019 02/16/2019  Falls in the past year? 1 0 0 0 0  Number falls in past yr: 1 0 0 - 0  Injury with Fall? 1 0 0 - 0  Follow up Falls evaluation completed - Falls evaluation completed Falls evaluation completed -     No Known Allergies  Prior to Admission medications   Medication Sig Start Date End Date Taking? Authorizing Provider  propranolol (INDERAL) 20 MG tablet Take 1 tablet (20 mg total) by mouth 2 (two) times daily. 09/03/20  Yes Everlena Cooper, Adam R, DO  ALPRAZolam Prudy Feeler) 0.5 MG tablet Take 1 tablet (0.5 mg total) by mouth at bedtime as needed for anxiety. Patient not taking: No sig reported 10/10/18   Georgina Quint, MD  diclofenac (VOLTAREN) 75 MG EC tablet Take 1 tablet (75 mg total) by mouth 2 (two) times daily. Patient not taking: No sig reported 04/07/20   Janeece Agee, NP  methocarbamol (ROBAXIN) 500 MG tablet Take 1 tablet (500 mg total) by mouth 4 (four) times daily. Patient not taking: No sig reported 04/07/20   Janeece Agee, NP  naproxen (NAPROSYN) 250 MG tablet Take 250 mg by mouth 2 (two) times  daily as needed for mild pain. Patient not taking: No sig reported    [provider]  zonisamide (ZONEGRAN) 25 MG capsule 25mg  daily for one week, then 50mg  daily for one week, then 75mg  daily for one week, then 100mg  daily. Patient not taking: No sig reported 08/14/19   , DO    Past Medical History:  Diagnosis Date  . Headache     Past Surgical History:  Procedure Laterality Date  . NO PAST SURGERIES      Social History   Tobacco Use  . Smoking status: Never Smoker  . Smokeless tobacco: Never Used  Substance Use Topics  . Alcohol use: No    Family History  Problem Relation Age of Onset  . Hypertension Mother   . Diabetes Father   . Pancreatic cancer Father   . Hypertension Sister   . Diabetes Sister   . Breast cancer Sister   . Hypertension Brother   . Diabetes Brother   . Diabetes Sister   . Diabetes Brother     Review of Systems  Constitutional: Negative for chills, fever and malaise/fatigue.  Eyes: Negative for blurred vision and double vision.  Respiratory: Negative for cough, shortness of breath and wheezing.   Cardiovascular: Negative for chest pain, palpitations and leg swelling.  Gastrointestinal: Negative for abdominal pain, blood  in stool, constipation, diarrhea, heartburn, nausea and vomiting.  Genitourinary: Negative for dysuria, frequency and hematuria.  Musculoskeletal: Positive for back pain. Negative for joint pain and neck pain.  Skin: Negative for rash.  Neurological: Negative for dizziness, tingling, sensory change, focal weakness, weakness and headaches.     OBJECTIVE:  Today's Vitals   01/21/21 1547  BP: 108/63  Pulse: 82  Temp: 98 F (36.7 C)  SpO2: 99%  Weight: 160 lb (72.6 kg)  Height: 5' (1.524 m)   Body mass index is 31.25 kg/m.   Physical Exam Constitutional:      General: She is not in acute distress.    Appearance: Normal appearance. She is not ill-appearing.  HENT:     Head: Normocephalic.   Cardiovascular:     Rate and Rhythm: Normal rate and regular rhythm.     Pulses: Normal pulses.     Heart sounds: Normal heart sounds. No murmur heard. No friction rub. No gallop.   Pulmonary:     Effort: Pulmonary effort is normal. No respiratory distress.     Breath sounds: Normal breath sounds. No stridor. No wheezing, rhonchi or rales.  Abdominal:     General: Bowel sounds are normal.     Palpations: Abdomen is soft.     Tenderness: There is no abdominal tenderness.  Musculoskeletal:     Cervical back: Normal.     Thoracic back: Normal.     Lumbar back: Normal.       Back:     Right lower leg: No edema.     Left lower leg: No edema.  Skin:    General: Skin is warm and dry.     Findings: No bruising.  Neurological:     Mental Status: She is alert and oriented to person, place, and time.  Psychiatric:        Mood and Affect: Mood normal.        Behavior: Behavior normal.     No results found for this or any previous visit (from the past 24 hour(s)).  No results found.   ASSESSMENT and PLAN  Problem List Items Addressed This Visit   None   Visit Diagnoses    Back pain, unspecified back location, unspecified back pain laterality, unspecified chronicity       Relevant Medications   diclofenac (VOLTAREN) 75 MG EC tablet   methocarbamol (ROBAXIN) 500 MG tablet      Plan . Stop aleve and ibuprofen take voltaren, robaxin and tylenol for pain . Conservative treatment for pain: ice, heat, rest, stretches . RTC/Ed precautions provided . Educated on r/se/b of medications   Return in about 3 months (around 04/20/2021), or if symptoms worsen or fail to improve.    Macario Carls Yukari Flax, FNP-BC Primary [facility] at [facility] [address] [place], [place] [address] Ph.  [phone] Fax [phone]

## 2021-09-07 ENCOUNTER — Other Ambulatory Visit: Payer: Self-pay | Admitting: Neurology

## 2021-09-14 ENCOUNTER — Telehealth: Payer: Self-pay | Admitting: Neurology

## 2021-09-14 NOTE — Telephone Encounter (Signed)
Pt called, she has an appt 09/17/21. She is out of her propanolol. She would like to see if jaffe would give her a few till then

## 2021-09-15 MED ORDER — PROPRANOLOL HCL 20 MG PO TABS: 20.0000 mg | ORAL_TABLET | Freq: Two times a day (BID) | ORAL | 0 refills | Status: DC

## 2021-09-15 NOTE — Telephone Encounter (Signed)
Per Dr.Jaffe refill a 30 day supply.

## 2021-09-16 NOTE — Progress Notes (Deleted)
NEUROLOGY FOLLOW UP OFFICE NOTE  Sindy Mccune [id number]  Assessment/Plan:   Migraine without aura, without status migrainosus, not intractable  Migraine prevention:  propranolol 20mg  twice daily Migraine rescue:  *** Limit use of pain relievers to no more than 2 days out of week to prevent risk of rebound or medication-overuse headache. Keep headache diary Follow up ***   Subjective:  Caroline Longie is a 50 year old right-handed woman who follows up for headaches.   UPDATE: Intensity:  *** Duration:  *** Frequency:  *** Current NSAIDS:   none Current analgesics:   None Current triptans: None Current ergotamine: None Current anti-emetic: None Current muscle relaxants: None Current anti-anxiolytic:  none Current sleep aide: None Current Antihypertensive medications: propranolol 20mg  twice daily Current Antidepressant medications:  None Current Anticonvulsant medications: none Current anti-CGRP: None Current Vitamins/Herbal/Supplements: magnesium Current Antihistamines/Decongestants: None Other therapy: None Hormone/birth control: None   Caffeine: 1 to 2 cups of coffee daily Diet:  Drinks a lot of water daily Exercise:  Not routine Depression:  no; Anxiety:  improved      HISTORY:  Past history of headaches.  She started having new onset headaches in September 2019.    They are a severe sharp pain in the right occipital region and sometimes above right eye and into jaw.  There is no preceding aura.    They are associated with nausea, heart burn, photophobia, phonophobia, blurred vision,burning and pain in hands.  There is no associated unilateral numbness or weakness..  It typically lasts all day (worse later in the day/night) and occurs 5 to 6 days a week.    They are triggered by using the phone.  It is relieved by rest.     Past NSAIDS:  Meloxicam, ibuprofen, naproxen (GI upset) Past analgesics:  tramadol, Excedrin, Fioricet Past abortive  triptans: sumatriptan 100mg  (made her feel awful), rizatriptan  (expensive) Past abortive ergotamine: None Past muscle relaxants: None Past anti-emetic: None Past antihypertensive medications: None Past antidepressant medications: amitriptyline 10mg  (nightmares) Past anticonvulsant medications: topiramate 25mg  (took for only a couple of nights because it caused difficulty falling asleep); zonisamide 25mg  (for one week, caused memory deficits and made her feel weak) Past anti-CGRP: None Past vitamins/Herbal/Supplements: None Past antihistamines/decongestants:  none Other past therapies:  chiropractic medication, acupuncture   CTA of the head performed on 12/04/2018 was unremarkable with no evidence of aneurysm.  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  MRI of brain with and without contrast on 01/17/2020 showed minimal scattered punctate hyperintense foci in the cerebral white matter but overall unremarkable.  PAST MEDICAL HISTORY: Past Medical History:  Diagnosis Date   Headache     MEDICATIONS: Current Outpatient Medications on File Prior to Visit  Medication Sig Dispense Refill   ALPRAZolam (XANAX) 0.5 MG tablet Take 1 tablet (0.5 mg total) by mouth at bedtime as needed for anxiety. (Patient not taking: No sig reported) 20 tablet 0   diclofenac (VOLTAREN) 75 MG EC tablet Take 1 tablet (75 mg total) by mouth 2 (two) times daily. 45 tablet 0   methocarbamol (ROBAXIN) 500 MG tablet Take 1 tablet (500 mg total) by mouth at bedtime as needed for muscle spasms. 45 tablet 0   propranolol (INDERAL) 20 MG tablet Take 1 tablet (20 mg total) by mouth 2 (two) times daily. 60 tablet 0   No current facility-administered medications on file prior to visit.    ALLERGIES: No Known Allergies  FAMILY HISTORY: Family History  Problem Relation Age of Onset   Hypertension Mother    Diabetes Father    Pancreatic cancer Father    Hypertension Sister     Diabetes Sister    Breast cancer Sister    Hypertension Brother    Diabetes Brother    Diabetes Sister    Diabetes Brother       Objective:  *** General: No acute distress.  Patient appears well-groomed.   Head:  Normocephalic/atraumatic Eyes:  Fundi examined but not visualized Neck: supple, no paraspinal tenderness, full range of motion Heart:  Regular rate and rhythm Lungs:  Clear to auscultation bilaterally Back: No paraspinal tenderness Neurological Exam: alert and oriented to person, place, and time.  Speech fluent and not dysarthric, language intact.  CN II-XII intact. Bulk and tone normal, muscle strength 5/5 throughout.  Sensation to light touch intact.  Deep tendon reflexes 2+ throughout, toes downgoing.  Finger to nose testing intact.  Gait normal, Romberg negative.   Shon Millet, DO

## 2021-09-17 ENCOUNTER — Encounter: Payer: Self-pay | Admitting: Neurology

## 2021-09-17 ENCOUNTER — Ambulatory Visit: Payer: Self-pay | Admitting: Neurology

## 2021-09-17 DIAGNOSIS — Z0279 Encounter for issue of other medical certificate: Secondary | ICD-10-CM

## 2021-10-16 ENCOUNTER — Telehealth: Payer: Self-pay | Admitting: Neurology

## 2021-10-16 NOTE — Telephone Encounter (Signed)
1. Which medications need to be refilled? (please list name of each medication and dose if known) propranolol    2. Which pharmacy/location (including street and city if local pharmacy) is medication to be sent to? [facility]

## 2021-10-16 NOTE — Telephone Encounter (Signed)
Patient advise she hasn't been seen 08/2020.  Will add pt to wait list.

## 2021-10-27 NOTE — Progress Notes (Signed)
NEUROLOGY FOLLOW UP OFFICE NOTE  Kathleen Gutierrez [id number]  Assessment/Plan:   1  Headaches now are more consistent with chronic tension-type headache, aggravated/triggered by underlying anxiety and depression 2  Anxiety and depression  Headache prevention:  Due to lightheadedness and since current headaches more likely tension type rather than migraine, discontinue propranolol (also propranolol may exacerbate depression).  Start tizanidine titrating to 2mg  three times daily.  I would recommend starting an antidepressant for headaches but I feel patient should be evaluated by psychiatry and treated with antidepressant for depression of the psychiatrist's choice. Refer to psychiatry Limit use of pain relievers to no more than 2 days out of week to prevent risk of rebound or medication-overuse headache. Will provide patient with contact to establish care with PCP. Follow up 6 months.   Subjective:  Kathleen Gutierrez is a 50 year old right-handed woman who follows up for headaches.   UPDATE: She was doing well but reports increased stress as her mother is sick and headaches have increased over past 4 months.  Reports increased nightmares and insomnia.  Out of propranolol for the past week.  Reports feeling lightheaded. Headaches are now a bifrontal pressure headache.  No associated symptoms. Intensity:  usually moderate but sometimes severe Duration:  all day Frequency:  daily Current NSAIDS:   ibuprofen (infrequently) Current analgesics:   None Current triptans: None Current ergotamine: None Current anti-emetic: None Current muscle relaxants: None Current anti-anxiolytic:  none Current sleep aide: None Current Antihypertensive medications: propranolol 20mg  twice daily Current Antidepressant medications:  None Current Anticonvulsant medications: none Current anti-CGRP: None Current Vitamins/Herbal/Supplements: magnesium Current Antihistamines/Decongestants:  None Other therapy: acupunture Hormone/birth control: None   Caffeine: 1 to 2 cups of coffee daily Diet:  Drinks a lot of water daily Exercise:  Not routine Depression:  no; Anxiety:  improved      HISTORY:  Past history of headaches.  She started having new onset headaches in September 2019.    They are a severe sharp pain in the right occipital region and sometimes above right eye and into jaw.  There is no preceding aura.    They are associated with nausea, heart burn, photophobia, phonophobia, blurred vision,burning and pain in hands.  There is no associated unilateral numbness or weakness..  It typically lasts all day (worse later in the day/night) and occurs 5 to 6 days a week.    They are triggered by using the phone.  It is relieved by rest.     Past NSAIDS:  Meloxicam, ibuprofen, naproxen (GI upset) Past analgesics:  tramadol, Excedrin, Fioricet Past abortive triptans: sumatriptan 100mg  (made her feel awful), rizatriptan  (expensive) Past abortive ergotamine: None Past muscle relaxants: None Past anti-emetic: None Past antihypertensive medications: None Past antidepressant medications: amitriptyline 10mg  (nightmares) Past anticonvulsant medications: topiramate 25mg  (took for only a couple of nights because it caused difficulty falling asleep); zonisamide 25mg  (for one week, caused memory deficits and made her feel weak) Past anti-CGRP: None Past vitamins/Herbal/Supplements: None Past antihistamines/decongestants:  none Other past therapies:  chiropractic medication, acupuncture   CTA of the head performed on 12/04/2018 was unremarkable with no evidence of aneurysm.  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  MRI of brain with and without contrast on 01/17/2020 showed minimal scattered punctate hyperintense foci in the cerebral white matter but overall unremarkable.  PAST MEDICAL HISTORY: Past Medical History:  Diagnosis Date   Headache      MEDICATIONS: Current Outpatient Medications on  File Prior to Visit  Medication Sig Dispense Refill   ALPRAZolam (XANAX) 0.5 MG tablet Take 1 tablet (0.5 mg total) by mouth at bedtime as needed for anxiety. (Patient not taking: No sig reported) 20 tablet 0   diclofenac (VOLTAREN) 75 MG EC tablet Take 1 tablet (75 mg total) by mouth 2 (two) times daily. 45 tablet 0   methocarbamol (ROBAXIN) 500 MG tablet Take 1 tablet (500 mg total) by mouth at bedtime as needed for muscle spasms. 45 tablet 0   propranolol (INDERAL) 20 MG tablet Take 1 tablet (20 mg total) by mouth 2 (two) times daily. 60 tablet 0   No current facility-administered medications on file prior to visit.    ALLERGIES: No Known Allergies  FAMILY HISTORY: Family History  Problem Relation Age of Onset   Hypertension Mother    Diabetes Father    Pancreatic cancer Father    Hypertension Sister    Diabetes Sister    Breast cancer Sister    Hypertension Brother    Diabetes Brother    Diabetes Sister    Diabetes Brother       Objective:  Blood pressure (!) 102/50, pulse 65, height 5' (1.524 m), weight 167 lb 6.4 oz (75.9 kg), SpO2 97 %. General: No acute distress.  Patient appears well-groomed.   Head:  Normocephalic/atraumatic Eyes:  Fundi examined but not visualized Neck: supple, no paraspinal tenderness, full range of motion Heart:  Regular rate and rhythm Lungs:  Clear to auscultation bilaterally Back: No paraspinal tenderness Neurological Exam: alert and oriented to person, place, and time.  Speech fluent and not dysarthric, language intact.  CN II-XII intact. Bulk and tone normal, muscle strength 5/5 throughout.  Sensation to light touch intact.  Deep tendon reflexes 2+ throughout, toes downgoing.  Finger to nose testing intact.  Gait normal, Romberg negative.   Shon Millet, DO

## 2021-10-28 ENCOUNTER — Other Ambulatory Visit: Payer: Self-pay

## 2021-10-28 ENCOUNTER — Ambulatory Visit (INDEPENDENT_AMBULATORY_CARE_PROVIDER_SITE_OTHER): Payer: Self-pay | Admitting: Neurology

## 2021-10-28 ENCOUNTER — Encounter: Payer: Self-pay | Admitting: Neurology

## 2021-10-28 VITALS — BP 102/50 | HR 65 | Ht 60.0 in | Wt 167.4 lb

## 2021-10-28 DIAGNOSIS — F32A Depression, unspecified: Secondary | ICD-10-CM

## 2021-10-28 DIAGNOSIS — G44229 Chronic tension-type headache, not intractable: Secondary | ICD-10-CM

## 2021-10-28 DIAGNOSIS — F419 Anxiety disorder, unspecified: Secondary | ICD-10-CM

## 2021-10-28 DIAGNOSIS — G4700 Insomnia, unspecified: Secondary | ICD-10-CM

## 2021-10-28 MED ORDER — TIZANIDINE HCL 2 MG PO TABS: ORAL_TABLET | ORAL | 0 refills | Status: DC

## 2021-10-28 NOTE — Patient Instructions (Signed)
Start tizanidine as prescribed Refer to psychiatry Refer to [facility] to establish with PCP Follow up 6 months.

## 2022-01-18 ENCOUNTER — Ambulatory Visit: Payer: No Typology Code available for payment source | Admitting: Neurology

## 2022-03-12 ENCOUNTER — Encounter: Payer: Self-pay | Admitting: Internal Medicine

## 2022-03-12 ENCOUNTER — Ambulatory Visit: Payer: Self-pay | Attending: Internal Medicine | Admitting: Internal Medicine

## 2022-03-12 VITALS — BP 109/66 | HR 68 | Resp 16 | Ht 60.0 in | Wt 163.4 lb

## 2022-03-12 DIAGNOSIS — E669 Obesity, unspecified: Secondary | ICD-10-CM

## 2022-03-12 DIAGNOSIS — Z7689 Persons encountering health services in other specified circumstances: Secondary | ICD-10-CM

## 2022-03-12 DIAGNOSIS — Z8619 Personal history of other infectious and parasitic diseases: Secondary | ICD-10-CM

## 2022-03-12 DIAGNOSIS — F320 Major depressive disorder, single episode, mild: Secondary | ICD-10-CM

## 2022-03-12 DIAGNOSIS — F411 Generalized anxiety disorder: Secondary | ICD-10-CM

## 2022-03-12 DIAGNOSIS — Z23 Encounter for immunization: Secondary | ICD-10-CM

## 2022-03-12 DIAGNOSIS — Z1211 Encounter for screening for malignant neoplasm of colon: Secondary | ICD-10-CM

## 2022-03-12 DIAGNOSIS — Z6831 Body mass index (BMI) 31.0-31.9, adult: Secondary | ICD-10-CM

## 2022-03-12 DIAGNOSIS — E66811 Obesity, class 1: Secondary | ICD-10-CM

## 2022-03-12 NOTE — Patient Instructions (Addendum)
Healthy Eating ?Following a healthy eating pattern may help you to achieve and maintain a healthy body weight, reduce the risk of chronic disease, and live a long and productive life. It is important to follow a healthy eating pattern at an appropriate calorie level for your body. Your nutritional needs should be met primarily through food by choosing a variety of nutrient-rich foods. ?What are tips for following this plan? ?Reading food labels ?Read labels and choose the following: ?Reduced or low sodium. ?Juices with 100% fruit juice. ?Foods with low saturated fats and high polyunsaturated and monounsaturated fats. ?Foods with whole grains, such as whole wheat, cracked wheat, brown rice, and wild rice. ?Whole grains that are fortified with folic acid. This is recommended for women who are pregnant or who want to become pregnant. ?Read labels and avoid the following: ?Foods with a lot of added sugars. These include foods that contain brown sugar, corn sweetener, corn syrup, dextrose, fructose, glucose, high-fructose corn syrup, honey, invert sugar, lactose, malt syrup, maltose, molasses, raw sugar, sucrose, trehalose, or turbinado sugar. ?Do not eat more than the following amounts of added sugar per day: ?6 teaspoons (25 g) for women. ?9 teaspoons (38 g) for men. ?Foods that contain processed or refined starches and grains. ?Refined grain products, such as white flour, degermed cornmeal, white bread, and white rice. ?Shopping ?Choose nutrient-rich snacks, such as vegetables, whole fruits, and nuts. Avoid high-calorie and high-sugar snacks, such as potato chips, fruit snacks, and candy. ?Use oil-based dressings and spreads on foods instead of solid fats such as butter, stick margarine, or cream cheese. ?Limit pre-made sauces, mixes, and "instant" products such as flavored rice, instant noodles, and ready-made pasta. ?Try more plant-protein sources, such as tofu, tempeh, black beans, edamame, lentils, nuts, and  seeds. ?Explore eating plans such as the Mediterranean diet or vegetarian diet. ?Cooking ?Use oil to saut? or stir-fry foods instead of solid fats such as butter, stick margarine, or lard. ?Try baking, boiling, grilling, or broiling instead of frying. ?Remove the fatty part of meats before cooking. ?Steam vegetables in water or broth. ?Meal planning ? ?At meals, imagine dividing your plate into fourths: ?One-half of your plate is fruits and vegetables. ?One-fourth of your plate is whole grains. ?One-fourth of your plate is protein, especially lean meats, poultry, eggs, tofu, beans, or nuts. ?Include low-fat dairy as part of your daily diet. ?Lifestyle ?Choose healthy options in all settings, including home, work, school, restaurants, or stores. ?Prepare your food safely: ?Wash your hands after handling raw meats. ?Keep food preparation surfaces clean by regularly washing with hot, soapy water. ?Keep raw meats separate from ready-to-eat foods, such as fruits and vegetables. ?Cook seafood, meat, poultry, and eggs to the recommended internal temperature. ?Store foods at safe temperatures. In general: ?Keep cold foods at 40?F (4.4?C) or below. ?Keep hot foods at 140?F (60?C) or above. ?Keep your freezer at 0?F (-17.8?C) or below. ?Foods are no longer safe to eat when they have been between the temperatures of 40?-140?F (4.4-60?C) for more than 2 hours. ?What foods should I eat? ?Fruits ?Aim to eat 2 cup-equivalents of fresh, canned (in natural juice), or frozen fruits each day. Examples of 1 cup-equivalent of fruit include 1 small apple, 8 large strawberries, 1 cup canned fruit, ? cup dried fruit, or 1 cup 100% juice. ?Vegetables ?Aim to eat 2?-3 cup-equivalents of fresh and frozen vegetables each day, including different varieties and colors. Examples of 1 cup-equivalent of vegetables include 2 medium carrots, 2 cups raw,  leafy greens, 1 cup chopped vegetable (raw or cooked), or 1 medium baked potato. ?Grains ?Aim to  eat 6 ounce-equivalents of whole grains each day. Examples of 1 ounce-equivalent of grains include 1 slice of bread, 1 cup ready-to-eat cereal, 3 cups popcorn, or ? cup cooked rice, pasta, or cereal. ?Meats and other proteins ?Aim to eat 5-6 ounce-equivalents of protein each day. Examples of 1 ounce-equivalent of protein include 1 egg, 1/2 cup nuts or seeds, or 1 tablespoon (16 g) peanut butter. A cut of meat or fish that is the size of a deck of cards is about 3-4 ounce-equivalents. ?Of the protein you eat each week, try to have at least 8 ounces come from seafood. This includes salmon, trout, herring, and anchovies. ?Dairy ?Aim to eat 3 cup-equivalents of fat-free or low-fat dairy each day. Examples of 1 cup-equivalent of dairy include 1 cup (240 mL) milk, 8 ounces (250 g) yogurt, 1? ounces (44 g) natural cheese, or 1 cup (240 mL) fortified soy milk. ?Fats and oils ?Aim for about 5 teaspoons (21 g) per day. Choose monounsaturated fats, such as canola and olive oils, avocados, peanut butter, and most nuts, or polyunsaturated fats, such as sunflower, corn, and soybean oils, walnuts, pine nuts, sesame seeds, sunflower seeds, and flaxseed. ?Beverages ?Aim for six 8-oz glasses of water per day. Limit coffee to three to five 8-oz cups per day. ?Limit caffeinated beverages that have added calories, such as soda and energy drinks. ?Limit alcohol intake to no more than 1 drink a day for nonpregnant women and 2 drinks a day for men. One drink equals 12 oz of beer (355 mL), 5 oz of wine (148 mL), or 1? oz of hard liquor (44 mL). ?Seasoning and other foods ?Avoid adding excess amounts of salt to your foods. Try flavoring foods with herbs and spices instead of salt. ?Avoid adding sugar to foods. ?Try using oil-based dressings, sauces, and spreads instead of solid fats. ?This information is based on general [place] nutrition guidelines. For more information, visit [employer]. Exact amounts may vary based on your nutrition  needs. ?Summary ?A healthy eating plan may help you to maintain a healthy weight, reduce the risk of chronic diseases, and stay active throughout your life. ?Plan your meals. Make sure you eat the right portions of a variety of nutrient-rich foods. ?Try baking, boiling, grilling, or broiling instead of frying. ?Choose healthy options in all settings, including home, work, school, restaurants, or stores. ?This information is not intended to replace advice given to you by your health care provider. Make sure you discuss any questions you have with your health care provider. ?Document Revised: 07/28/2021 Document Reviewed: 07/28/2021 ?Elsevier Patient Education ? [employer]. ? ?Genital Herpes ?Genital herpes is a common sexually transmitted infection (STI) that is caused by a virus. The virus spreads from person to person through sexual contact. The infection can cause itching, blisters, and sores around the genitals or rectum. Symptoms may last for several days and then go away. This is called an outbreak. The virus remains in the body, however, so more outbreaks may happen in the future. The time between outbreaks varies and can be from months to years. ?Genital herpes can affect anyone. It is particularly concerning for pregnant women because the virus can be passed to the baby during delivery and can cause serious problems. Genital herpes is also a concern for people who have a weak disease-fighting system (immune system). ?What are the causes? ?This condition is caused by  the human herpesvirus, also called herpes simplex virus, type 1 or type 2 (HSV-1 or HSV-2). The virus may spread through: ?Sexual contact with an infected person, including vaginal, anal, and oral sex. ?Contact with fluid from a herpes sore. ?The skin. This means that you can get herpes from an infected partner even if there are no blisters or sores present. Your partner may not know that he or she is infected. ?What increases the  risk? ?You are more likely to develop this condition if: ?You have sex with many partners. ?You do not use latex condoms during sex. ?What are the signs or symptoms? ?Most people do not have symptoms (are asymptomat

## 2022-03-12 NOTE — Progress Notes (Signed)
? ? ?Patient ID: Kathleen Gutierrez, female    DOB: 1971-12-11  MRN: [id number] ? ?CC: New Patient (Initial Visit) ? ? ?Subjective: ?Kathleen Gutierrez is a 51 y.o. female who presents for new pt visit ?Her concerns today include:  ?Pt with hx of tension HA followed by Dr. Everlena Cooper, anx/dep ? ?Previous PCP was at [facility] that close last yr.  ? ?On Valtrex for genital herpes prophylaxis.  Dx in 10/2021.  ?Sees her therapist once a wk at [facility] for anx/dep. Doing well with therapy.  Not on med and does not desire to be on med for this ? ?BMI >30.  Very active during the day.  Walks daily with her dog for 30-45 mins and cleans empty apartments during the day.  Admits some days she overeats when she feels anxious. Admits she loves bread and pasta.  Eats fruits and veggies daily.  Wants to get down to 140 lbs ? ?HM: last PAP smear was 1 yr ago with GYN.  Did not have flu shot, agrees to have flu shot.  Due for colon cancer screen.  ? ? ?Patient Active Problem List  ? Diagnosis Date Noted  ? Acute non intractable tension-type headache 09/14/2018  ? Situational anxiety 09/14/2018  ?  ? ?Current Outpatient Medications on File Prior to Visit  ?Medication Sig Dispense Refill  ? valACYclovir (VALTREX) 1000 MG tablet Take 1,000 mg by mouth daily. Take 1/2 tablet by mouth once a day for supression    ? ?No current facility-administered medications on file prior to visit.  ? ? ?No Known Allergies ? ?Social History  ? ?Socioeconomic History  ? Marital status: Married  ?  Spouse name: Not on file  ? Number of children: 3  ? Years of education: Not on file  ? Highest education level: 12th grade  ?Occupational History  ? Occupation: unemployed  ?Tobacco Use  ? Smoking status: Never  ? Smokeless tobacco: Never  ?Vaping Use  ? Vaping Use: Never used  ?Substance and Sexual Activity  ? Alcohol use: No  ? Drug use: No  ? Sexual activity: Yes  ?Other Topics Concern  ? Not on file  ?Social History Narrative  ? Does not worked - stays at  home with 52 year old daughter.  ?   ? Patient is right-handed. She lives with her boyfriend in a one level home. She drinks one cup of coffee a day. She does not exercise.  ? ?Social Determinants of Health  ? ?Financial Resource Strain: Not on file  ?Food Insecurity: Not on file  ?Transportation Needs: Not on file  ?Physical Activity: Not on file  ?Stress: Not on file  ?Social Connections: Not on file  ?Intimate Partner Violence: Not on file  ? ? ?Family History  ?Problem Relation Age of Onset  ? Hypertension Mother   ? Diabetes Father   ? Pancreatic cancer Father   ? Hypertension Sister   ? Diabetes Sister   ? Breast cancer Sister   ? Hypertension Brother   ? Diabetes Brother   ? Diabetes Sister   ? Diabetes Brother   ? ? ?Past Surgical History:  ?Procedure Laterality Date  ? NO PAST SURGERIES    ? ? ?ROS: ?Review of Systems ?Negative except as stated above ? ?PHYSICAL EXAM: ?BP 109/66   Pulse 68   Resp 16   Ht 5' (1.524 m)   Wt 163 lb 6.4 oz (74.1 kg)   SpO2 96%   BMI 31.91 kg/m?   ?  Wt Readings from Last 3 Encounters:  ?03/12/22 163 lb 6.4 oz (74.1 kg)  ?10/28/21 167 lb 6.4 oz (75.9 kg)  ?01/21/21 160 lb (72.6 kg)  ? ? ?Physical Exam ? ?General appearance - alert, well appearing, middle-aged female and in no distress ?Mental status - normal mood, behavior, speech, dress, motor activity, and thought processes ?Eyes - pupils equal and reactive, extraocular eye movements intact ?Mouth - mucous membranes moist, pharynx normal without lesions ?Neck - supple, no significant adenopathy ?Chest - clear to auscultation, no wheezes, rales or rhonchi, symmetric air entry ?Heart - normal rate, regular rhythm, normal S1, S2, no murmurs, rubs, clicks or gallops ?Extremities - peripheral pulses normal, no pedal edema, no clubbing or cyanosis ? ?  03/12/2022  ?  2:13 PM 01/21/2021  ?  3:28 PM 04/07/2020  ? 11:01 AM  ?Depression screen PHQ 2/9  ?Decreased Interest 1 0 0  ?Down, Depressed, Hopeless 2 0 0  ?PHQ - 2 Score 3 0 0   ?Altered sleeping 1    ?Tired, decreased energy 1    ?Change in appetite 1    ?Feeling bad or failure about yourself  1    ?Trouble concentrating 1    ?Moving slowly or fidgety/restless 0    ?Suicidal thoughts 0    ?PHQ-9 Score 8    ? ? ?  03/12/2022  ?  2:13 PM  ?GAD 7 : Generalized Anxiety Score  ?Nervous, Anxious, on Edge 2  ?Control/stop worrying 1  ?Worry too much - different things 1  ?Trouble relaxing 0  ?Restless 1  ?Easily annoyed or irritable 0  ?Afraid - awful might happen 0  ?Total GAD 7 Score 5  ? ? ? ? ? ?  Latest Ref Rng & Units 04/07/2020  ? 11:25 AM 03/22/2019  ?  4:02 PM 10/12/2018  ?  5:33 PM  ?CMP  ?Glucose 65 - 99 mg/dL 637    858    ?BUN 6 - 24 mg/dL 11    13    ?Creatinine 0.57 - 1.00 mg/dL 8.50    2.77    ?Sodium 134 - 144 mmol/L 137   142   141    ?Potassium 3.5 - 5.2 mmol/L 4.4   4.3   3.7    ?Chloride 96 - 106 mmol/L 105    106    ?CO2 20 - 29 mmol/L 21      ?Calcium 8.7 - 10.2 mg/dL 9.3      ?Total Protein 6.0 - 8.5 g/dL 7.1      ?Total Bilirubin 0.0 - 1.2 mg/dL 0.3      ?Alkaline Phos 39 - 117 IU/L 72      ?AST 0 - 40 IU/L 27      ?ALT 0 - 32 IU/L 40      ? ?Lipid Panel  ?No results found for: CHOL, TRIG, HDL, CHOLHDL, VLDL, LDLCALC, LDLDIRECT ? ?CBC ?   ?Component Value Date/Time  ? WBC 6.9 04/07/2020 1125  ? WBC 7.7 10/24/2017 1102  ? WBC 13.2 (H) 04/26/2015 0510  ? RBC 4.63 04/07/2020 1125  ? RBC 4.80 10/24/2017 1102  ? RBC 3.28 (L) 04/26/2015 0510  ? HGB 14.6 04/07/2020 1125  ? HCT 42.8 04/07/2020 1125  ? PLT 265 04/07/2020 1125  ? MCV 92 04/07/2020 1125  ? MCH 31.5 04/07/2020 1125  ? MCH 30.4 10/24/2017 1102  ? MCH 31.7 04/26/2015 0510  ? MCHC 34.1 04/07/2020 1125  ? MCHC 34.6 10/24/2017 1102  ?  MCHC 34.1 04/26/2015 0510  ? RDW 11.9 04/07/2020 1125  ? LYMPHSABS 1.8 04/07/2020 1125  ? MONOABS 0.2 06/06/2010 1954  ? EOSABS 0.1 04/07/2020 1125  ? BASOSABS 0.1 04/07/2020 1125  ? ? ?ASSESSMENT AND PLAN: ? ?1. Establishing care with new doctor, encounter for ? ? ?2. Obesity (BMI  30.0-34.9) ?Patient advised to eliminate sugary drinks from the diet, cut back on portion sizes especially of white carbohydrates, eat more white lean meat like chicken [place]key and seafood instead of beef or pork and incorporate fresh fruits and vegetables into the diet daily. ?Encouraged her to stay active as she has been doing. ? ?3. History of herpes genitalis ?Discussed genital herpes.  Patient had question of whether she can still pass on the virus to a partner if she is on prophylactic therapy.  Informed her that taking the Valtrex prophylactically helps decrease asymptomatic shedding of the virus and in doing so decreases risks of transmission but I cannot say that risks of transmission is 0% ? ?4. Major depressive disorder, single episode, mild (HCC) ?5. GAD (generalized anxiety disorder) ?Reports doing well with CBT.  She does not feel she needs medicine at this time. ? ?6. Need for influenza vaccination ?Flu shot given today by CMA ? ?7. Screening for colon cancer ?- Fecal occult blood, imunochemical([facility]) ? ? ?Patient was given the opportunity to ask questions.  Patient verbalized understanding of the plan and was able to repeat key elements of the plan.  ? ?This documentation was completed using Paediatric nurseDragon voice recognition technology.  Any transcriptional errors are unintentional. ? ?Orders Placed This Encounter  ?Procedures  ? Fecal occult blood, imunochemical([facility])  ? ? ? ?Requested Prescriptions  ? ? No prescriptions requested or ordered in this encounter  ? ? ?Return if symptoms worsen or fail to improve, for Sign release to get PAP smear report from her GYN. ? ?Jonah Blueeborah Jaria Conway, MD, FACP ?

## 2022-06-09 ENCOUNTER — Ambulatory Visit: Payer: Self-pay | Admitting: Neurology

## 2022-08-05 ENCOUNTER — Ambulatory Visit: Payer: No Typology Code available for payment source | Admitting: Physician Assistant

## 2023-02-02 ENCOUNTER — Other Ambulatory Visit: Payer: Self-pay | Admitting: Obstetrics and Gynecology

## 2023-02-02 ENCOUNTER — Other Ambulatory Visit: Payer: Self-pay

## 2023-02-02 DIAGNOSIS — N6320 Unspecified lump in the left breast, unspecified quadrant: Secondary | ICD-10-CM

## 2023-02-02 DIAGNOSIS — Z1231 Encounter for screening mammogram for malignant neoplasm of breast: Secondary | ICD-10-CM

## 2023-02-15 ENCOUNTER — Telehealth: Payer: Self-pay

## 2023-02-15 ENCOUNTER — Ambulatory Visit: Payer: Self-pay | Admitting: *Deleted

## 2023-02-15 VITALS — BP 98/68 | Wt 158.6 lb

## 2023-02-15 DIAGNOSIS — Z1211 Encounter for screening for malignant neoplasm of colon: Secondary | ICD-10-CM

## 2023-02-15 DIAGNOSIS — Z1239 Encounter for other screening for malignant neoplasm of breast: Secondary | ICD-10-CM

## 2023-02-15 NOTE — Patient Instructions (Signed)
Explained breast self awareness with Kathleen Gutierrez. Patient did not need a Pap smear today due to last Pap smear was 2 years ago per patient. Let her know B[facility]CP will cover Pap smears every 3 years unless has a history of abnormal Pap smears. Referred patient to the [facility] for a diagnostic mammogram. Appointment scheduled Thursday, February 17, 2023 at 1310. Patient aware of appointment and will be there. Kathleen Gutierrez verbalized understanding.  [name], Arvil Chaco, RN 1:06 PM

## 2023-02-15 NOTE — Telephone Encounter (Signed)
I have left a message for [facility] requesting pts most recent PAP be faxed to [place]. Fax number was provided aong with pts full name and DOB.

## 2023-02-15 NOTE — Progress Notes (Signed)
[name] is a 52 y.o. female who presents to [facility] clinic today with complaint of left outer breast lump and pain x two weeks. Patient states the lump is tender to the touch. Patient rates the pain at a 4-5 out of 10.    Pap Smear: Pap smear not completed today. Last Pap smear was 2 years ago at [facility] clinic and was normal per patient. Per patient has no history of an abnormal Pap smear. Last Pap smear result is not available in Epic.   Physical exam: Breasts Breasts symmetrical. No skin abnormalities bilateral breasts. No nipple retraction bilateral breasts. No nipple discharge bilateral breasts. No lymphadenopathy. No lumps palpated bilateral breasts. Unable to palpate a lump in patients area of concern within her left breast. Complaints of left outer breast tenderness on exam.       Pelvic/Bimanual Pap is not indicated today per BCCCP guidelines.   Smoking History: Patient has never smoked.   Patient Navigation: Patient education provided. Access to services provided for patient through [facility] program.   Colorectal Cancer Screening: Per patient has never had colonoscopy completed. FIT Test given to patient 03/12/2022 to complete and was not returned. Explained the importance of colorectal cancer screenings. FIT Test given to patient today complete. No complaints today.    Breast and Cervical Cancer Risk Assessment: Patient has a family history of her sister having breast cancer. Patient has no known genetic mutations or history of radiation treatment to the chest before age 40. Patient does not have history of cervical dysplasia, immunocompromised, or DES exposure in-utero.  Risk Scores as of 02/15/2023     Kathleen Gutierrez           5-year 1.84 %   Lifetime 17.08 %   This patient is [place]/Lat[place] but has no documented birth country, so the [place] used data from [place] patients to calculate their risk score. Document a birth country in the Demographics activity for  a more accurate score.         Last calculated by Kathleen Gutierrez, CMA on 02/15/2023 at 12:58 PM        A: BCCCP exam without pap smear Complaint of left breast and pain.  P: Referred patient to the [facility] for a diagnostic mammogram. Appointment scheduled Thursday, February 17, 2023 at 1310.  Loletta Parish, RN 02/15/2023 1:06 PM

## 2023-02-17 ENCOUNTER — Ambulatory Visit
Admission: RE | Admit: 2023-02-17 | Discharge: 2023-02-17 | Disposition: A | Discharge status: Home / Self Care | Payer: No Typology Code available for payment source | Source: Ambulatory Visit | Attending: Obstetrics and Gynecology | Admitting: Obstetrics and Gynecology

## 2023-02-17 DIAGNOSIS — N6320 Unspecified lump in the left breast, unspecified quadrant: Secondary | ICD-10-CM

## 2023-02-23 LAB — SPECIMEN STATUS REPORT

## 2023-02-23 LAB — FECAL OCCULT BLOOD, IMMUNOCHEMICAL: Fecal Occult Bld: NEGATIVE

## 2023-03-01 ENCOUNTER — Other Ambulatory Visit: Payer: Self-pay | Admitting: Obstetrics and Gynecology

## 2023-03-01 DIAGNOSIS — N6489 Other specified disorders of breast: Secondary | ICD-10-CM

## 2023-03-29 NOTE — Telephone Encounter (Signed)
I have left another message for [facility] at [phone] requesting pts last PAP be faxed to [phone].

## 2023-04-12 NOTE — Congregational Nurse Program (Signed)
  Dept: [phone]   Congregational Nurse Program Note  Date of Encounter: 04/12/2023  Past Medical History: Past Medical History:  Diagnosis Date   Headache     Encounter Details:  CNP Questionnaire - 04/12/23 1733       Questionnaire   Ask client: Do you give verbal consent for me to treat you today? Yes    Student Assistance N/A    Location Patient Served  [facility] [employer]    Visit Setting with Client Organization    Patient Status Unknown    Insurance Unknown    Insurance/Financial Assistance Referral N/A    Medication N/A    Medical Provider Yes    Screening Referrals Made N/A    Medical Referrals Made N/A    Medical Appointment Made N/A    Recently w/o PCP, now 1st time PCP visit completed due to CNs referral or appointment made N/A    Food N/A    Transportation N/A    Housing/Utilities N/A    Interpersonal Safety N/A    Interventions Case Management    Abnormal to Normal Screening Since Last CN Visit N/A    Screenings CN Performed Weight    Sent Client to Lab for: N/A    Did client attend any of the following based off CNs referral or appointments made? N/A    ED Visit Averted N/A    Life-Saving Intervention Made N/A            Wt 160 lb, client here to get information on lawyers and to check weight.

## 2023-04-21 ENCOUNTER — Other Ambulatory Visit: Payer: Self-pay

## 2023-04-21 NOTE — Telephone Encounter (Signed)
Records have been received a  from [facility]. See attached.

## 2023-08-23 ENCOUNTER — Ambulatory Visit
Admission: RE | Admit: 2023-08-23 | Discharge: 2023-08-23 | Disposition: A | Discharge status: Home / Self Care | Payer: No Typology Code available for payment source | Source: Ambulatory Visit | Attending: Obstetrics and Gynecology | Admitting: Obstetrics and Gynecology

## 2023-08-23 ENCOUNTER — Other Ambulatory Visit: Payer: Self-pay | Admitting: Obstetrics and Gynecology

## 2023-08-23 DIAGNOSIS — N6489 Other specified disorders of breast: Secondary | ICD-10-CM

## 2024-01-30 ENCOUNTER — Telehealth: Payer: Self-pay

## 2024-01-30 NOTE — Telephone Encounter (Signed)
Telephoned patient using language line interpreter#[id number]. Left a message with BCCCP contact information. BCCCP

## 2024-02-16 ENCOUNTER — Other Ambulatory Visit: Payer: Self-pay

## 2024-02-16 DIAGNOSIS — N6489 Other specified disorders of breast: Secondary | ICD-10-CM

## 2024-04-12 ENCOUNTER — Ambulatory Visit: Payer: Self-pay | Admitting: *Deleted

## 2024-04-12 ENCOUNTER — Ambulatory Visit
Admission: RE | Admit: 2024-04-12 | Discharge: 2024-04-12 | Disposition: A | Discharge status: Home / Self Care | Source: Ambulatory Visit | Attending: Obstetrics and Gynecology | Admitting: Obstetrics and Gynecology

## 2024-04-12 ENCOUNTER — Other Ambulatory Visit: Payer: Self-pay

## 2024-04-12 VITALS — BP 103/69 | Wt 152.0 lb

## 2024-04-12 DIAGNOSIS — Z1211 Encounter for screening for malignant neoplasm of colon: Secondary | ICD-10-CM

## 2024-04-12 DIAGNOSIS — Z1239 Encounter for other screening for malignant neoplasm of breast: Secondary | ICD-10-CM

## 2024-04-12 DIAGNOSIS — N644 Mastodynia: Secondary | ICD-10-CM

## 2024-04-12 DIAGNOSIS — N6489 Other specified disorders of breast: Secondary | ICD-10-CM

## 2024-04-12 NOTE — Progress Notes (Signed)
 Kathleen Gutierrez is a 53 y.o. female who presents to [facility] clinic today with complaint of left outer breast pain noted on previous exam 02/15/2023. Last diagnostic mammogram was completed 08/23/2023 with a bilateral diagnostic mammogram 45-month follow up recommended.   Pap Smear: Pap smear not completed today. Last Pap smear was 12/17/2020 at [facility] clinic and was normal with negative HPV. Per patient has no history of an abnormal Pap smear. Last Pap smear result is available in Epic.    Physical exam: Breasts Breasts symmetrical. No skin abnormalities bilateral breasts. No nipple retraction bilateral breasts. No nipple discharge bilateral breasts. No lymphadenopathy. No lumps palpated bilateral breasts. Complaints of left outer breast tenderness on exam noted on previous exam 02/15/2023.  MS 3D DIAG MAMMO BILAT BR (aka MM) Result Date: 04/12/2024 CLINICAL DATA:  Patient presents for bilateral diagnostic examination to follow-up a probable benign asymmetry over the posterior outer left breast. EXAM: DIGITAL DIAGNOSTIC BILATERAL MAMMOGRAM WITH TOMOSYNTHESIS AND CAD TECHNIQUE: Bilateral digital diagnostic mammography and breast tomosynthesis was performed. The images were evaluated with computer-aided detection. COMPARISON:  Previous exam(s). ACR Breast Density Category b: There are scattered areas of fibroglandular density. FINDINGS: Exam demonstrates no change in minimal asymmetry overlying the pectoralis muscle over the outer left breast seen only on the exaggerated CC image. This is likely due to normal variation along the pectoralis muscle surface. Remainder of the left breast is unchanged. Right breast is unchanged. IMPRESSION: Stable asymmetry overlying the pectoralis muscle over the outer left breast. Stable right breast. RECOMMENDATION: Recommend 1 additional follow-up diagnostic left breast mammogram in 1 year at the time of patient's annual bilateral mammogram in April 2026 to document  2 years of stability. I have discussed the findings and recommendations with the patient. If applicable, a reminder letter will be sent to the patient regarding the next appointment. BI-RADS CATEGORY  3: Probably benign. Electronically Signed   By: Roda Cirri M.D.   On: 04/12/2024 13:14   MM 3D DIAGNOSTIC MAMMOGRAM UNILATERAL LEFT BREAST Result Date: 08/23/2023 CLINICAL DATA:  53 year old female presents for six-month follow-up of LEFT breast asymmetry, previously without sonographic correlate. EXAM: DIGITAL DIAGNOSTIC UNILATERAL LEFT MAMMOGRAM WITH TOMOSYNTHESIS AND CAD TECHNIQUE: Left digital diagnostic mammography and breast tomosynthesis was performed. The images were evaluated with computer-aided detection. COMPARISON:  Previous exam(s). ACR Breast Density Category b: There are scattered areas of fibroglandular density. FINDINGS: Full field views of the LEFT breast demonstrate no significant change in the posterior OUTER LEFT breast asymmetry, best identified on the exaggerated CC view. No new mammographic abnormalities within the LEFT breast identified. IMPRESSION: 1. No significant change in posterior OUTER LEFT breast asymmetry. Six-month follow-up recommended to ensure 1 year stability. 2. No new mammographic findings within the LEFT breast. RECOMMENDATION: Bilateral diagnostic mammogram in 6 months. I have discussed the findings and recommendations with the patient. If applicable, a reminder letter will be sent to the patient regarding the next appointment. BI-RADS CATEGORY  3: Probably benign. Electronically Signed   By: Sundra Engel M.D.   On: 08/23/2023 15:40   MS DIGITAL DIAG TOMO BILAT Result Date: 02/17/2023 CLINICAL DATA:  The patient had a recent lump in the lateral left breast which has resolved. The pain associated with the lump persists but has improved. EXAM: DIGITAL DIAGNOSTIC BILATERAL MAMMOGRAM WITH TOMOSYNTHESIS; ULTRASOUND LEFT BREAST LIMITED TECHNIQUE: Bilateral digital diagnostic  mammography and breast tomosynthesis was performed.; Targeted ultrasound examination of the left breast was performed. COMPARISON:  Previous exam(s). ACR Breast  Density Category b: There are scattered areas of fibroglandular density. FINDINGS: An asymmetry in the deep left breast lateral to the nipple partially resolves on additional imaging and is felt to represent an [place] of glandular tissue superimposed over muscle. This region of the breast is never been pulled into view on previous studies. No other suspicious mammographic findings in either breast. No other interval changes. Targeted ultrasound is performed, showing no abnormality in the region of the patient's pain. No abnormality in the lateral inferior left breast to correlate with the left breast asymmetry. IMPRESSION: Probably benign left breast asymmetry described above. No cause for pain identified. No other suspicious findings. RECOMMENDATION: Recommend six-month follow-up mammogram of the left breast asymmetry. I have discussed the findings and recommendations with the patient. If applicable, a reminder letter will be sent to the patient regarding the next appointment. BI-RADS CATEGORY  3: Probably benign. Electronically Signed   By: Lorrene Rosser III M.D.   On: 02/17/2023 14:15     Pelvic/Bimanual Pap is not indicated today per BCCCP guidelines.    Smoking History: Patient has never smoked.   Patient Navigation: Patient education provided. Access to services provided for patient through BCCCP program.  Colorectal Cancer Screening: Per patient has never had colonoscopy completed. FIT Test completed 02/21/2023 and negative. FIT Test given to patient to complete. No complaints today.    Breast and Cervical Cancer Risk Assessment: Patient has a family history of her sister having breast cancer. Patient has no known genetic mutations or history of radiation treatment to the chest before age 39. Patient does not have history of cervical  dysplasia, immunocompromised, or DES exposure in-utero.   Risk Scores as of Encounter on 04/12/2024     Gregary Lean           5-year 1.9%   Lifetime 16.85%   This patient is [place]/Latina but has no documented birth country, so the [place] model used data from [place] patients to calculate their risk score. Document a birth country in the Demographics activity for a more accurate score.         Last calculated by Silas, Ansyi K, CMA on 04/12/2024 at 11:35 AM        A: BCCCP exam without pap smear Complaint of left breast pain.  P: Referred patient to the [facility] for a diagnostic mammogram per recommendation. Appointment scheduled Thursday, Apr 12, 2024 at 1240.  Stefan Edge, RN 04/12/2024 11:53 AM

## 2024-04-12 NOTE — Patient Instructions (Signed)
 Explained breast self awareness with Eleanore Grey Castro-Guerrero. Patient did not need a Pap smear today due to last Pap smear and HPV typing was 12/17/2020. Let her know BCCCP will cover Pap smears and HPV typing every 5 years unless has a history of abnormal Pap smears. Referred patient to the [facility] for a diagnostic mammogram per recommendation. Appointment scheduled Thursday, Apr 12, 2024 at 1240. Patient aware of appointment and will be there. Eleanore Grey Castro-Guerrero verbalized understanding.  Cadance Raus, Dela Favor, RN 11:53 AM

## 2024-04-19 ENCOUNTER — Ambulatory Visit

## 2024-04-19 ENCOUNTER — Encounter

## 2024-05-01 LAB — FECAL OCCULT BLOOD, IMMUNOCHEMICAL: Fecal Occult Bld: NEGATIVE

## 2024-05-02 ENCOUNTER — Ambulatory Visit: Payer: Self-pay
